# Patient Record
Sex: Female | Born: 1980 | Race: White | Hispanic: No | Marital: Married | Smoking: Never smoker
Health system: Southern US, Community
[De-identification: ages and names within clinical notes are randomized; demographics above are authoritative.]

## PROBLEM LIST (undated history)

## (undated) ENCOUNTER — Inpatient Hospital Stay (HOSPITAL_COMMUNITY): Payer: Self-pay

## (undated) DIAGNOSIS — Z8619 Personal history of other infectious and parasitic diseases: Secondary | ICD-10-CM

## (undated) DIAGNOSIS — R51 Headache: Secondary | ICD-10-CM

## (undated) DIAGNOSIS — D649 Anemia, unspecified: Secondary | ICD-10-CM

## (undated) HISTORY — DX: Personal history of other infectious and parasitic diseases: Z86.19

## (undated) HISTORY — PX: MYRINGOTOMY: SUR874

## (undated) HISTORY — DX: Anemia, unspecified: D64.9

## (undated) HISTORY — DX: Headache: R51

---

## 2007-06-12 ENCOUNTER — Inpatient Hospital Stay (HOSPITAL_COMMUNITY): Admission: AD | Admit: 2007-06-12 | Discharge: 2007-06-12 | Payer: Self-pay | Admitting: Obstetrics and Gynecology

## 2007-07-19 ENCOUNTER — Inpatient Hospital Stay (HOSPITAL_COMMUNITY): Admission: RE | Admit: 2007-07-19 | Discharge: 2007-07-22 | Payer: Self-pay | Admitting: Obstetrics and Gynecology

## 2007-07-24 ENCOUNTER — Encounter: Admission: RE | Admit: 2007-07-24 | Discharge: 2007-08-23 | Payer: Self-pay | Admitting: Obstetrics and Gynecology

## 2011-01-29 LAB — URINALYSIS, ROUTINE W REFLEX MICROSCOPIC
Bilirubin Urine: NEGATIVE
Glucose, UA: NEGATIVE
Hgb urine dipstick: NEGATIVE
Specific Gravity, Urine: 1.02
Urobilinogen, UA: 0.2
pH: 6.5

## 2011-02-01 LAB — CBC
HCT: 28.9 — ABNORMAL LOW
Hemoglobin: 10.2 — ABNORMAL LOW
MCHC: 35.1
MCV: 92.4
MCV: 93.1
Platelets: 142 — ABNORMAL LOW
Platelets: 146 — ABNORMAL LOW
RBC: 3.13 — ABNORMAL LOW
RDW: 14.2
RDW: 14.9
WBC: 10.2
WBC: 11.2 — ABNORMAL HIGH
WBC: 9.4

## 2011-02-01 LAB — COMPREHENSIVE METABOLIC PANEL
ALT: 10
AST: 19
CO2: 23
Calcium: 9
Chloride: 104
Creatinine, Ser: 0.81
GFR calc Af Amer: >60
GFR calc non Af Amer: >60
Glucose, Bld: 106 — ABNORMAL HIGH
Total Bilirubin: 0.5

## 2011-02-01 LAB — URIC ACID: Uric Acid, Serum: 5.9

## 2011-02-01 LAB — LACTATE DEHYDROGENASE: LDH: 114

## 2011-03-31 LAB — OB RESULTS CONSOLE GC/CHLAMYDIA
Chlamydia: NEGATIVE
Gonorrhea: NEGATIVE

## 2011-03-31 LAB — OB RESULTS CONSOLE RPR: RPR: NONREACTIVE

## 2011-03-31 LAB — OB RESULTS CONSOLE ANTIBODY SCREEN: Antibody Screen: NEGATIVE

## 2011-05-11 NOTE — L&D Delivery Note (Signed)
Delivery Note At 5:32 PM a viable female was delivered via Vaginal, Spontaneous Delivery (Presentation: ;  ).  APGAR: , ; weight .   Placenta status: , .  Cord:  with the following complications: .  Cord pH: not sent  Anesthesia: Epidural  Episiotomy:  Lacerations: first degr Suture Repair: 3.0 chromic Est. Blood Loss (mL): 350 cc  Mom to postpartum.  Baby to nursery-stable.  Meriel Pica 11/01/2011, 5:40 PM

## 2011-10-11 LAB — OB RESULTS CONSOLE GBS: GBS: NEGATIVE

## 2011-10-27 ENCOUNTER — Telehealth (HOSPITAL_COMMUNITY): Payer: Self-pay | Admitting: *Deleted

## 2011-10-27 ENCOUNTER — Encounter (HOSPITAL_COMMUNITY): Payer: Self-pay | Admitting: *Deleted

## 2011-10-27 NOTE — Telephone Encounter (Signed)
Preadmission screen  

## 2011-10-29 ENCOUNTER — Encounter (HOSPITAL_COMMUNITY): Payer: Self-pay | Admitting: *Deleted

## 2011-10-29 ENCOUNTER — Telehealth (HOSPITAL_COMMUNITY): Payer: Self-pay | Admitting: *Deleted

## 2011-10-29 NOTE — Telephone Encounter (Signed)
Preadmission screen  

## 2011-11-01 ENCOUNTER — Inpatient Hospital Stay (HOSPITAL_COMMUNITY)
Admission: RE | Admit: 2011-11-01 | Discharge: 2011-11-02 | DRG: 373 | Disposition: A | Discharge status: Home / Self Care | Payer: BC Managed Care – PPO | Source: Ambulatory Visit | Attending: Obstetrics and Gynecology | Admitting: Obstetrics and Gynecology

## 2011-11-01 ENCOUNTER — Encounter (HOSPITAL_COMMUNITY): Payer: Self-pay

## 2011-11-01 ENCOUNTER — Inpatient Hospital Stay (HOSPITAL_COMMUNITY): Payer: BC Managed Care – PPO | Admitting: Anesthesiology

## 2011-11-01 ENCOUNTER — Encounter (HOSPITAL_COMMUNITY): Payer: Self-pay | Admitting: Anesthesiology

## 2011-11-01 LAB — CBC
Hemoglobin: 10.6 g/dL — ABNORMAL LOW (ref 12.0–15.0)
MCH: 30.4 pg (ref 26.0–34.0)
MCHC: 32.6 g/dL (ref 30.0–36.0)
MCV: 93.1 fL (ref 78.0–100.0)
Platelets: 184 10*3/uL (ref 150–400)

## 2011-11-01 LAB — ABO/RH: ABO/RH(D): A POS

## 2011-11-01 LAB — RPR: RPR Ser Ql: NONREACTIVE

## 2011-11-01 MED ORDER — WITCH HAZEL-GLYCERIN EX PADS
1.0000 "application " | MEDICATED_PAD | CUTANEOUS | Status: DC | PRN
  Administered 2011-11-01: 1 via TOPICAL

## 2011-11-01 MED ORDER — OXYTOCIN 40 UNITS IN LACTATED RINGERS INFUSION - SIMPLE MED
1.0000 m[IU]/min | INTRAVENOUS | Status: DC
  Administered 2011-11-01: 2 m[IU]/min via INTRAVENOUS

## 2011-11-01 MED ORDER — EPHEDRINE 5 MG/ML INJ: 10.0000 mg | INTRAVENOUS | Status: DC | PRN

## 2011-11-01 MED ORDER — OXYTOCIN 40 UNITS IN LACTATED RINGERS INFUSION - SIMPLE MED
62.5000 mL/h | Freq: Once | INTRAVENOUS | Status: DC
  Filled 2011-11-01: qty 1000

## 2011-11-01 MED ORDER — PHENYLEPHRINE 40 MCG/ML (10ML) SYRINGE FOR IV PUSH (FOR BLOOD PRESSURE SUPPORT): 80.0000 ug | PREFILLED_SYRINGE | INTRAVENOUS | Status: DC | PRN

## 2011-11-01 MED ORDER — EPHEDRINE 5 MG/ML INJ
10.0000 mg | INTRAVENOUS | Status: DC | PRN
  Filled 2011-11-01: qty 4

## 2011-11-01 MED ORDER — SENNOSIDES-DOCUSATE SODIUM 8.6-50 MG PO TABS
2.0000 | ORAL_TABLET | Freq: Every day | ORAL | Status: DC
  Administered 2011-11-01: 2 via ORAL

## 2011-11-01 MED ORDER — MEASLES, MUMPS & RUBELLA VAC ~~LOC~~ INJ: 0.5000 mL | INJECTION | Freq: Once | SUBCUTANEOUS | Status: DC

## 2011-11-01 MED ORDER — ACETAMINOPHEN 325 MG PO TABS: 650.0000 mg | ORAL_TABLET | ORAL | Status: DC | PRN

## 2011-11-01 MED ORDER — LACTATED RINGERS IV SOLN
INTRAVENOUS | Status: DC
  Administered 2011-11-01 (×2): via INTRAVENOUS

## 2011-11-01 MED ORDER — TETANUS-DIPHTH-ACELL PERTUSSIS 5-2.5-18.5 LF-MCG/0.5 IM SUSP: 0.5000 mL | Freq: Once | INTRAMUSCULAR | Status: DC

## 2011-11-01 MED ORDER — DIPHENHYDRAMINE HCL 25 MG PO CAPS: 25.0000 mg | ORAL_CAPSULE | Freq: Four times a day (QID) | ORAL | Status: DC | PRN

## 2011-11-01 MED ORDER — DIPHENHYDRAMINE HCL 50 MG/ML IJ SOLN: 12.5000 mg | INTRAMUSCULAR | Status: DC | PRN

## 2011-11-01 MED ORDER — TERBUTALINE SULFATE 1 MG/ML IJ SOLN: 0.2500 mg | Freq: Once | INTRAMUSCULAR | Status: DC | PRN

## 2011-11-01 MED ORDER — LIDOCAINE HCL (PF) 1 % IJ SOLN
30.0000 mL | INTRAMUSCULAR | Status: DC | PRN
  Filled 2011-11-01: qty 30

## 2011-11-01 MED ORDER — PHENYLEPHRINE 40 MCG/ML (10ML) SYRINGE FOR IV PUSH (FOR BLOOD PRESSURE SUPPORT)
80.0000 ug | PREFILLED_SYRINGE | INTRAVENOUS | Status: DC | PRN
  Filled 2011-11-01: qty 5

## 2011-11-01 MED ORDER — FLEET ENEMA 7-19 GM/118ML RE ENEM: 1.0000 | ENEMA | Freq: Every day | RECTAL | Status: DC | PRN

## 2011-11-01 MED ORDER — OXYCODONE-ACETAMINOPHEN 5-325 MG PO TABS
1.0000 | ORAL_TABLET | Freq: Four times a day (QID) | ORAL | Status: DC | PRN
  Administered 2011-11-02: 1 via ORAL
  Filled 2011-11-01: qty 1

## 2011-11-01 MED ORDER — IBUPROFEN 800 MG PO TABS
800.0000 mg | ORAL_TABLET | Freq: Three times a day (TID) | ORAL | Status: DC | PRN
  Administered 2011-11-01 – 2011-11-02 (×3): 800 mg via ORAL
  Filled 2011-11-01 (×3): qty 1

## 2011-11-01 MED ORDER — ONDANSETRON HCL 4 MG PO TABS: 4.0000 mg | ORAL_TABLET | ORAL | Status: DC | PRN

## 2011-11-01 MED ORDER — SIMETHICONE 80 MG PO CHEW: 80.0000 mg | CHEWABLE_TABLET | ORAL | Status: DC | PRN

## 2011-11-01 MED ORDER — FENTANYL 2.5 MCG/ML BUPIVACAINE 1/10 % EPIDURAL INFUSION (WH - ANES)
14.0000 mL/h | INTRAMUSCULAR | Status: DC
  Administered 2011-11-01: 14 mL/h via EPIDURAL
  Administered 2011-11-01: 13 mL/h via EPIDURAL
  Filled 2011-11-01 (×2): qty 60

## 2011-11-01 MED ORDER — BENZOCAINE-MENTHOL 20-0.5 % EX AERO
1.0000 "application " | INHALATION_SPRAY | CUTANEOUS | Status: DC | PRN
  Administered 2011-11-01: 1 via TOPICAL
  Filled 2011-11-01: qty 56

## 2011-11-01 MED ORDER — OXYCODONE-ACETAMINOPHEN 5-325 MG PO TABS: 1.0000 | ORAL_TABLET | ORAL | Status: DC | PRN

## 2011-11-01 MED ORDER — ONDANSETRON HCL 4 MG/2ML IJ SOLN: 4.0000 mg | Freq: Four times a day (QID) | INTRAMUSCULAR | Status: DC | PRN

## 2011-11-01 MED ORDER — OXYTOCIN BOLUS FROM INFUSION
250.0000 mL | Freq: Once | INTRAVENOUS | Status: DC
  Filled 2011-11-01: qty 500

## 2011-11-01 MED ORDER — PRENATAL MULTIVITAMIN CH
1.0000 | ORAL_TABLET | Freq: Every day | ORAL | Status: DC
  Administered 2011-11-01 – 2011-11-02 (×2): 1 via ORAL
  Filled 2011-11-01 (×2): qty 1

## 2011-11-01 MED ORDER — FLEET ENEMA 7-19 GM/118ML RE ENEM: 1.0000 | ENEMA | RECTAL | Status: DC | PRN

## 2011-11-01 MED ORDER — ZOLPIDEM TARTRATE 5 MG PO TABS: 5.0000 mg | ORAL_TABLET | Freq: Every evening | ORAL | Status: DC | PRN

## 2011-11-01 MED ORDER — LANOLIN HYDROUS EX OINT: TOPICAL_OINTMENT | CUTANEOUS | Status: DC | PRN

## 2011-11-01 MED ORDER — DIBUCAINE 1 % RE OINT: 1.0000 "application " | TOPICAL_OINTMENT | RECTAL | Status: DC | PRN

## 2011-11-01 MED ORDER — CITRIC ACID-SODIUM CITRATE 334-500 MG/5ML PO SOLN: 30.0000 mL | ORAL | Status: DC | PRN

## 2011-11-01 MED ORDER — IBUPROFEN 600 MG PO TABS: 600.0000 mg | ORAL_TABLET | Freq: Four times a day (QID) | ORAL | Status: DC | PRN

## 2011-11-01 MED ORDER — BISACODYL 10 MG RE SUPP: 10.0000 mg | Freq: Every day | RECTAL | Status: DC | PRN

## 2011-11-01 MED ORDER — ONDANSETRON HCL 4 MG/2ML IJ SOLN: 4.0000 mg | INTRAMUSCULAR | Status: DC | PRN

## 2011-11-01 MED ORDER — LIDOCAINE HCL (PF) 1 % IJ SOLN
INTRAMUSCULAR | Status: DC | PRN
  Administered 2011-11-01 (×2): 5 mL

## 2011-11-01 MED ORDER — LACTATED RINGERS IV SOLN: 500.0000 mL | Freq: Once | INTRAVENOUS | Status: DC

## 2011-11-01 MED ORDER — LACTATED RINGERS IV SOLN: 500.0000 mL | INTRAVENOUS | Status: DC | PRN

## 2011-11-01 NOTE — Anesthesia Procedure Notes (Signed)
Epidural Patient location during procedure: OB Start time: 11/01/2011 1:10 PM  Staffing Anesthesiologist: Brayton Caves R Performed by: anesthesiologist   Preanesthetic Checklist Completed: patient identified, site marked, surgical consent, pre-op evaluation, timeout performed, IV checked, risks and benefits discussed and monitors and equipment checked  Epidural Patient position: sitting Prep: site prepped and draped and DuraPrep Patient monitoring: continuous pulse ox and blood pressure Approach: midline Injection technique: LOR air and LOR saline  Needle:  Needle type: Tuohy  Needle gauge: 17 G Needle length: 9 cm Needle insertion depth: 6 cm Catheter type: closed end flexible Catheter size: 19 Gauge Catheter at skin depth: 11 cm Test dose: negative  Assessment Events: blood not aspirated, injection not painful, no injection resistance, negative IV test and no paresthesia  Additional Notes Patient identified.  Risk benefits discussed including failed block, incomplete pain control, headache, nerve damage, paralysis, blood pressure changes, nausea, vomiting, reactions to medication both toxic or allergic, and postpartum back pain.  Patient expressed understanding and wished to proceed.  All questions were answered.  Sterile technique used throughout procedure and epidural site dressed with sterile barrier dressing. No paresthesia or other complications noted.The patient did not experience any signs of intravascular injection such as tinnitus or metallic taste in mouth nor signs of intrathecal spread such as rapid motor block. Please see nursing notes for vital signs.

## 2011-11-01 NOTE — Progress Notes (Signed)
3/50%/-2>>AROM>>clear AF, stable FHR

## 2011-11-01 NOTE — H&P (Signed)
Briana Booker  DICTATION # [id number] CSN# [id number]   Meriel Pica, MD 11/01/2011 9:41 AM

## 2011-11-01 NOTE — Anesthesia Preprocedure Evaluation (Signed)
Anesthesia Evaluation  Patient identified by MRN, date of birth, ID band Patient awake    Reviewed: Allergy & Precautions, H&P , Patient's Chart, lab work & pertinent test results  Airway Mallampati: III TM Distance: >3 FB Neck ROM: full    Dental No notable dental hx.    Pulmonary neg pulmonary ROS,  breath sounds clear to auscultation  Pulmonary exam normal       Cardiovascular negative cardio ROS  Rhythm:regular Rate:Normal     Neuro/Psych  Headaches, negative neurological ROS  negative psych ROS   GI/Hepatic negative GI ROS, Neg liver ROS,   Endo/Other  negative endocrine ROSMorbid obesity  Renal/GU negative Renal ROS     Musculoskeletal   Abdominal   Peds  Hematology negative hematology ROS (+)   Anesthesia Other Findings   Reproductive/Obstetrics (+) Pregnancy                           Anesthesia Physical Anesthesia Plan  ASA: III  Anesthesia Plan: Epidural   Post-op Pain Management:    Induction:   Airway Management Planned:   Additional Equipment:   Intra-op Plan:   Post-operative Plan:   Informed Consent: I have reviewed the patients History and Physical, chart, labs and discussed the procedure including the risks, benefits and alternatives for the proposed anesthesia with the patient or authorized representative who has indicated his/her understanding and acceptance.     Plan Discussed with:   Anesthesia Plan Comments:         Anesthesia Quick Evaluation

## 2011-11-01 NOTE — H&P (Signed)
NAMEROBINN, OVERHOLT NO.:  [id number]  MEDICAL RECORD NO.:  [id number]  LOCATION:  9163                          FACILITY:  [facility]  PHYSICIAN:  Duke Salvia. Marcelle Overlie, M.D.DATE OF BIRTH:  06-May-1981  DATE OF ADMISSION:  11/01/2011 DATE OF DISCHARGE:                             HISTORY & PHYSICAL   CHIEF COMPLAINT:  For labor induction, SROM.  HISTORY OF PRESENT ILLNESS:  A 31 year old, G2, P55, EDD November 07, 2011, was scheduled today for AROM induction with a favorable cervix at term, was seen in MAU this morning.  On exam, had ruptured membranes, after examination, clear fluids.  Admitted for labor and induction.  GBS was negative.  Her prenatal course has otherwise been uncomplicated.  One hour GTT was 106.  PAST MEDICAL HISTORY:  Please see the [facility] form for details.  PHYSICAL EXAMINATION:  VITAL SIGNS:  Temperature 98.2, blood pressure 120/78.  HEENT:  Unremarkable.  NECK:  Supple without masses.  LUNGS:  Clear.  CARDIOVASCULAR:  Regular rate and rhythm without murmurs, rubs, or gallops.  BREASTS:  Not examined.  ABDOMEN:  Term fundal height.  Fetal heart rate 148.  Cervix was 3-4, 75% vertex -2.  Clear fluid noted.  EXTREMITIES:  Unremarkable.  NEUROLOGIC:  Unremarkable.  IMPRESSION:  Term intrauterine pregnancy, scheduled for labor induction, spontaneous rupture of membranes.  PLAN:  We will admit for labor.     Emmanuel Gruenhagen M. Marcelle Overlie, M.D.     [facility]/MEDQ  D:  11/01/2011  T:  11/01/2011  Job:  [id number]

## 2011-11-02 LAB — CBC
HCT: 28.8 % — ABNORMAL LOW (ref 36.0–46.0)
MCV: 93.8 fL (ref 78.0–100.0)
RDW: 14.7 % (ref 11.5–15.5)
WBC: 13.1 10*3/uL — ABNORMAL HIGH (ref 4.0–10.5)

## 2011-11-02 NOTE — Anesthesia Postprocedure Evaluation (Signed)
  Anesthesia Post-op Note  Patient: Briana Booker  Procedure(s) Performed: * No surgery found *  Patient Location: Mother/Baby  Anesthesia Type: Epidural  Level of Consciousness: awake  Airway and Oxygen Therapy: Patient Spontanous Breathing  Post-op Pain: none  Post-op Assessment: Post-op Vital signs reviewed  Post-op Vital Signs: Reviewed and stable  Complications: No apparent anesthesia complications

## 2011-11-02 NOTE — Discharge Summary (Signed)
Obstetric Discharge Summary Reason for Admission: rupture of membranes Prenatal Procedures: ultrasound Intrapartum Procedures: spontaneous vaginal delivery Postpartum Procedures: none Complications-Operative and Postpartum: 1 degree perineal laceration Hemoglobin  Date Value Range Status  11/02/2011 9.6* 12.0 - 15.0 g/dL Final     HCT  Date Value Range Status  11/02/2011 28.8* 36.0 - 46.0 % Final    Physical Exam:  General: alert and cooperative Lochia: appropriate Uterine Fundus: firm Incision: perineum intact DVT Evaluation: No evidence of DVT seen on physical exam.  Discharge Diagnoses: Term Pregnancy-delivered  Discharge Information: Date: 11/02/2011 Activity: pelvic rest Diet: routine Medications: PNV, Ibuprofen and Percocet Condition: stable Instructions: refer to practice specific booklet Discharge to: home   Newborn Data: Live born female  Birth Weight: 9 lb 1.7 oz (4130 g) APGAR: 8, 9  Home with mother.  CURTIS,CAROL G 11/02/2011, 8:47 AM

## 2011-11-02 NOTE — Progress Notes (Signed)
Post Partum Day 1 Subjective: no complaints, up ad lib, voiding, tolerating PO, + flatus and desires discharge later this pm  Objective: Blood pressure 113/80, pulse 80, temperature 97.7 F (36.5 C), temperature source Oral, resp. rate 20, height 5\' 1"  (1.549 m), weight 95.255 kg (210 lb), last menstrual period 01/31/2011, SpO2 97.00%, unknown if currently breastfeeding.  Physical Exam:  General: alert and cooperative Lochia: appropriate Uterine Fundus: firm Incision: perineum intact DVT Evaluation: No evidence of DVT seen on physical exam.   Basename 11/02/11 0520 11/01/11 0750  HGB 9.6* 10.6*  HCT 28.8* 32.5*    Assessment/Plan: Plan discharge later this pm   LOS: 1 day   CURTIS,CAROL G 11/02/2011, 8:42 AM

## 2011-11-07 ENCOUNTER — Inpatient Hospital Stay (HOSPITAL_COMMUNITY): Admission: AD | Admit: 2011-11-07 | Payer: Self-pay | Source: Ambulatory Visit | Admitting: Obstetrics and Gynecology

## 2012-11-17 ENCOUNTER — Emergency Department (HOSPITAL_COMMUNITY): Payer: BC Managed Care – PPO

## 2012-11-17 ENCOUNTER — Emergency Department (HOSPITAL_COMMUNITY)
Admission: EM | Admit: 2012-11-17 | Discharge: 2012-11-18 | Disposition: A | Discharge status: Home / Self Care | Payer: BC Managed Care – PPO | Attending: Emergency Medicine | Admitting: Emergency Medicine

## 2012-11-17 ENCOUNTER — Encounter (HOSPITAL_COMMUNITY): Payer: Self-pay | Admitting: Adult Health

## 2012-11-17 DIAGNOSIS — Z8619 Personal history of other infectious and parasitic diseases: Secondary | ICD-10-CM | POA: Insufficient documentation

## 2012-11-17 DIAGNOSIS — B9689 Other specified bacterial agents as the cause of diseases classified elsewhere: Secondary | ICD-10-CM | POA: Insufficient documentation

## 2012-11-17 DIAGNOSIS — R109 Unspecified abdominal pain: Secondary | ICD-10-CM | POA: Insufficient documentation

## 2012-11-17 DIAGNOSIS — R110 Nausea: Secondary | ICD-10-CM | POA: Insufficient documentation

## 2012-11-17 DIAGNOSIS — A499 Bacterial infection, unspecified: Secondary | ICD-10-CM | POA: Insufficient documentation

## 2012-11-17 DIAGNOSIS — N760 Acute vaginitis: Secondary | ICD-10-CM | POA: Insufficient documentation

## 2012-11-17 DIAGNOSIS — Z862 Personal history of diseases of the blood and blood-forming organs and certain disorders involving the immune mechanism: Secondary | ICD-10-CM | POA: Insufficient documentation

## 2012-11-17 DIAGNOSIS — Z3202 Encounter for pregnancy test, result negative: Secondary | ICD-10-CM | POA: Insufficient documentation

## 2012-11-17 LAB — URINALYSIS, ROUTINE W REFLEX MICROSCOPIC
Glucose, UA: NEGATIVE mg/dL
Hgb urine dipstick: NEGATIVE
Leukocytes, UA: NEGATIVE
Specific Gravity, Urine: 1.028 (ref 1.005–1.030)
pH: 5.5 (ref 5.0–8.0)

## 2012-11-17 LAB — CBC WITH DIFFERENTIAL/PLATELET
Basophils Relative: 0 % (ref 0–1)
Eosinophils Absolute: 0.1 10*3/uL (ref 0.0–0.7)
HCT: 36.6 % (ref 36.0–46.0)
Hemoglobin: 12.3 g/dL (ref 12.0–15.0)
MCH: 29.9 pg (ref 26.0–34.0)
MCHC: 33.6 g/dL (ref 30.0–36.0)
Monocytes Absolute: 0.5 10*3/uL (ref 0.1–1.0)
Monocytes Relative: 6 % (ref 3–12)
Neutrophils Relative %: 72 % (ref 43–77)

## 2012-11-17 LAB — WET PREP, GENITAL
Trich, Wet Prep: NONE SEEN
Yeast Wet Prep HPF POC: NONE SEEN

## 2012-11-17 LAB — BASIC METABOLIC PANEL
BUN: 13 mg/dL (ref 6–23)
Creatinine, Ser: 0.85 mg/dL (ref 0.50–1.10)
GFR calc Af Amer: >90 mL/min (ref >90)
GFR calc non Af Amer: >90 mL/min (ref >90)

## 2012-11-17 MED ORDER — IOHEXOL 300 MG/ML  SOLN
25.0000 mL | Freq: Once | INTRAMUSCULAR | Status: AC | PRN
  Administered 2012-11-17: 25 mL via ORAL

## 2012-11-17 MED ORDER — IOHEXOL 300 MG/ML  SOLN
100.0000 mL | Freq: Once | INTRAMUSCULAR | Status: AC | PRN
  Administered 2012-11-17: 100 mL via INTRAVENOUS

## 2012-11-17 NOTE — ED Notes (Addendum)
Presents with mid abdominal pain and lower back pain that began Sunday described as cramping and assoicated with nausea and lack of appetite. Pain is worse in lower back than in stomach. Sent over by Dr. Zachery Dauer office to rule out appendicitis and high WBC count.  Pt is currently breastfeeding at night time. Last food at 1 pm, last drink at 18:33.

## 2012-11-17 NOTE — ED Provider Notes (Signed)
History    CSN: [id number] Arrival date & time 11/17/12  1816  First MD Initiated Contact with Patient 11/17/12 2106     Chief Complaint  Patient presents with  . Abdominal Pain   (Consider location/radiation/quality/duration/timing/severity/associated sxs/prior Treatment) HPI Comments: Patient presents today with a chief complaint of lower abdominal pain.  Pain primarily in the suprapubic and RLQ.  She reports that the pain has been present for the past 5 days and has been constant.  Pain does not radiate.  Nothing makes the pain better or worse.  She has taken Tylenol for the pain without relief.  She was seen by her PCP prior to arrival today and was sent to the ED to rule out an Appendicitis.  She denies fever or chills.  She reports some mild nausea, but no vomiting.  Denies diarrhea.  Last BM was yesterday.  She reports that her bowel movement was harder in consistency than usual and she had to strain somewhat.  She denies urinary symptoms.  Denies vaginal bleeding or discharge.  No prior abdominal surgeries.  The history is provided by the patient.   Past Medical History  Diagnosis Date  . H/O varicella   . Headache(784.0)     migraine  . Anemia    Past Surgical History  Procedure Laterality Date  . Myringotomy      x2   Family History  Problem Relation Age of Onset  . Diabetes Father   . Hypertension Father   . Muscular dystrophy Cousin    History  Substance Use Topics  . Smoking status: Never Smoker   . Smokeless tobacco: Never Used  . Alcohol Use: No   OB History   Grav Para Term Preterm Abortions TAB SAB Ect Mult Living   2 2 2  0 0 0 0 0 0 2     Review of Systems  Constitutional: Negative for fever and chills.  Gastrointestinal: Positive for abdominal pain. Negative for nausea, vomiting, diarrhea, constipation and blood in stool.  Genitourinary: Negative for dysuria, urgency, frequency, vaginal bleeding and vaginal discharge.  All other systems reviewed  and are negative.    Allergies  Review of patient's allergies indicates no known allergies.  Home Medications   Current Outpatient Rx  Name  Route  Sig  Dispense  Refill  . acetaminophen (TYLENOL) 325 MG tablet   Oral   Take 650 mg by mouth 2 (two) times daily as needed for pain.         . Oxycodone-Acetaminophen (PERCOCET PO)   Oral   Take 0.5 tablets by mouth daily as needed (pain).          BP 132/81  Pulse 99  Temp(Src) 98.6 F (37 C) (Oral)  Resp 18  SpO2 19% Physical Exam  Nursing note and vitals reviewed. Constitutional: She appears well-developed and well-nourished.  HENT:  Head: Normocephalic and atraumatic.  Neck: Normal range of motion. Neck supple.  Cardiovascular: Normal rate, regular rhythm and normal heart sounds.   Pulmonary/Chest: Effort normal and breath sounds normal.  Abdominal: Soft. Normal appearance and bowel sounds are normal. She exhibits no distension and no mass. There is tenderness in the right lower quadrant and suprapubic area. There is no rebound and no guarding.  Genitourinary: Vagina normal. Cervix exhibits no motion tenderness. Right adnexum displays no mass, no tenderness and no fullness. Left adnexum displays no mass, no tenderness and no fullness.  Neurological: She is alert.  Skin: Skin is warm and dry.  Psychiatric: She has a normal mood and affect.    ED Course  Procedures (including critical care time) Labs Reviewed  BASIC METABOLIC PANEL - Abnormal; Notable for the following:    Glucose, Bld 116 (*)    All other components within normal limits  URINALYSIS, ROUTINE W REFLEX MICROSCOPIC - Abnormal; Notable for the following:    Bilirubin Urine SMALL (*)    All other components within normal limits  GC/CHLAMYDIA PROBE AMP  WET PREP, GENITAL  CBC WITH DIFFERENTIAL  POCT PREGNANCY, URINE   Ct Abdomen Pelvis W Contrast  11/18/2012   *RADIOLOGY REPORT*  Clinical Data: Mid abdominal and lower back pain.  Nausea.  Leukocytosis.  Clinical concern for appendicitis.  CT ABDOMEN AND PELVIS WITH CONTRAST  Technique:  Multidetector CT imaging of the abdomen and pelvis was performed following the standard protocol during bolus administration of intravenous contrast.  Contrast: OMNIPAQUE IOHEXOL 300 MG/ML  SOLN  Comparison: None.  Findings: Normally positioned intrauterine device.  Normal appearing ovaries, urinary bladder, kidneys, pancreas, gallbladder, adrenal glands, liver and spleen.  Minimally prominent mesenteric lymph nodes without pathological enlargement.  No gastrointestinal abnormalities.  Normal appearing appendix.  No free peritoneal fluid or air.  Clear lung bases.  Unremarkable bones.  IMPRESSION: Normal examination.   Original Report Authenticated By: Beckie Salts, M.D.   No diagnosis found.  MDM  Patient presenting with RLQ and suprapubic abdominal pain that has been present for the past 5 days.  Patient sent to the ED by her PCP to obtain a CT to rule out an Acute Appendicitis.  Patient is afebrile.  Labs and UA unremarkable aside from clue cells on the wet prep.  Urine pregnancy negative.  CT scan results as above are normal.  Therefore, feel that the patient is stable for discharge.  Pascal Lux [facility], PA-C 11/18/12 1810

## 2012-11-17 NOTE — ED Notes (Signed)
Pt. C/o abdominal pain, low back pain and nausea since Sunday. Denies vomiting, pain with urination, vaginal discharge. Pt. Sent here from  PCP to rule out appendicitis. No rebound tenderness.

## 2012-11-18 LAB — GC/CHLAMYDIA PROBE AMP
CT Probe RNA: NEGATIVE
GC Probe RNA: NEGATIVE

## 2012-11-18 MED ORDER — HYDROCODONE-ACETAMINOPHEN 5-325 MG PO TABS: 1.0000 | ORAL_TABLET | Freq: Four times a day (QID) | ORAL | Status: DC | PRN

## 2012-11-18 MED ORDER — ONDANSETRON 4 MG PO TBDP: 4.0000 mg | ORAL_TABLET | Freq: Three times a day (TID) | ORAL | Status: DC | PRN

## 2012-11-18 MED ORDER — ONDANSETRON HCL 4 MG/2ML IJ SOLN
4.0000 mg | Freq: Once | INTRAMUSCULAR | Status: AC
  Administered 2012-11-18: 4 mg via INTRAVENOUS
  Filled 2012-11-18: qty 2

## 2012-11-18 MED ORDER — METRONIDAZOLE 500 MG PO TABS: 500.0000 mg | ORAL_TABLET | Freq: Two times a day (BID) | ORAL | Status: DC

## 2012-11-18 NOTE — ED Provider Notes (Signed)
Medical screening examination/treatment/procedure(s) were performed by non-physician practitioner and as supervising physician I was immediately available for consultation/collaboration.   Glynn Octave, MD 11/18/12 [phone]

## 2012-11-18 NOTE — ED Notes (Signed)
Unable to obtain e-signature due to signature pad not working.  Pt verbalized understanding of d/c and f/u.  No additional questions by pt.  VSS. Ambulatory at discharge.

## 2013-12-22 IMAGING — CT CT ABD-PELV W/ CM
2 of 4 series · 16 of 46 positions shown, 18 images · IV contrast (APPLIED)
Comparison: None.

CLINICAL DATA: Mid abdominal and lower back pain.  Nausea.
Leukocytosis.  Clinical concern for appendicitis..

CT ABDOMEN AND PELVIS WITH CONTRAST
TECHNIQUE: Multidetector CT imaging of the abdomen and pelvis was
performed following the standard protocol during bolus
administration of intravenous contrast.
Contrast: 100mL OMNIPAQUE IOHEXOL 300 MG/ML  SOLN

[Series 2: abd/ pelvis 5.0 i30f 1 · axial · 0.82mm/px · z∈[+734,+1129]mm · 13 of 87 slices shown, 15 images]
[im 4/87  soft-tissue]
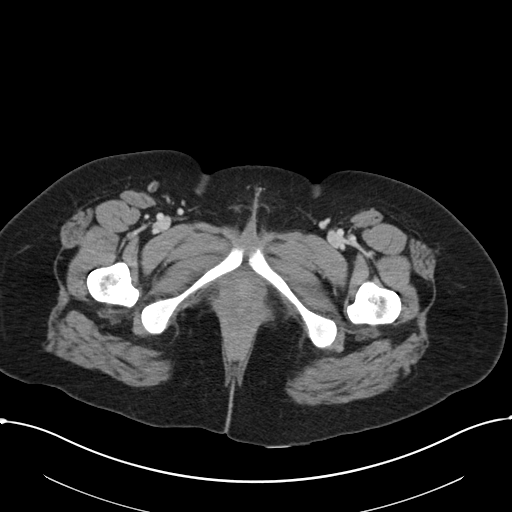
[im 4/87  bone]
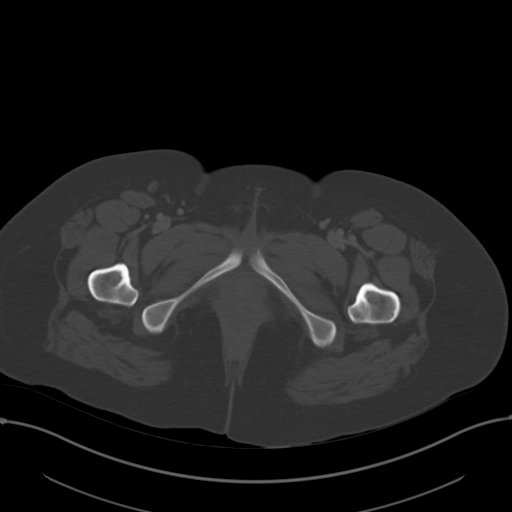
[im 12/87  soft-tissue]
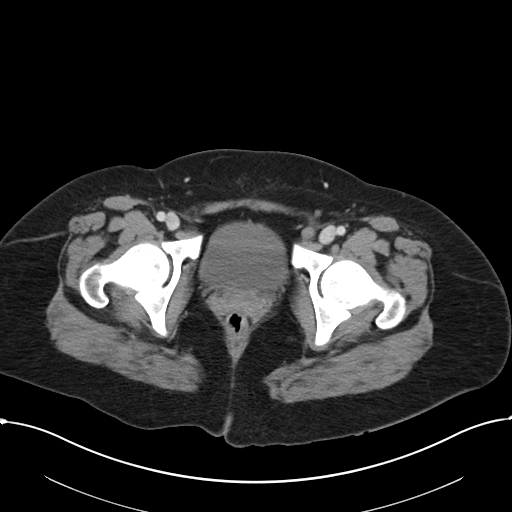
[im 19/87  soft-tissue]
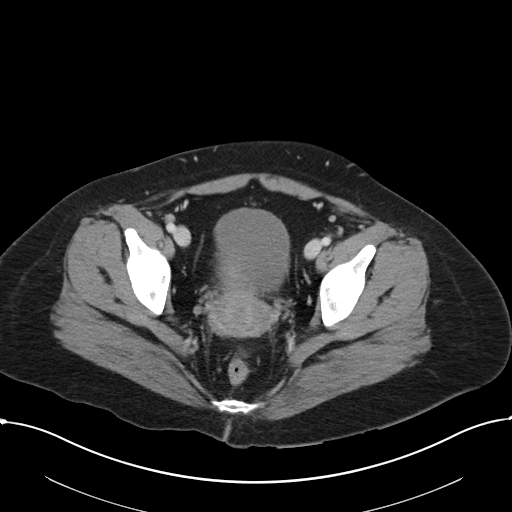
[im 23/87  soft-tissue]
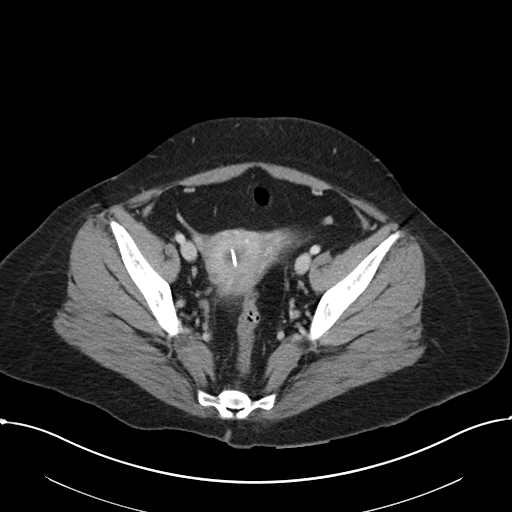
[im 30/87  soft-tissue]
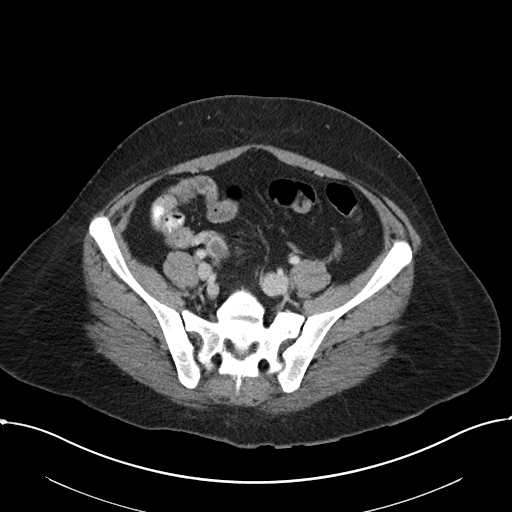
[im 38/87  soft-tissue]
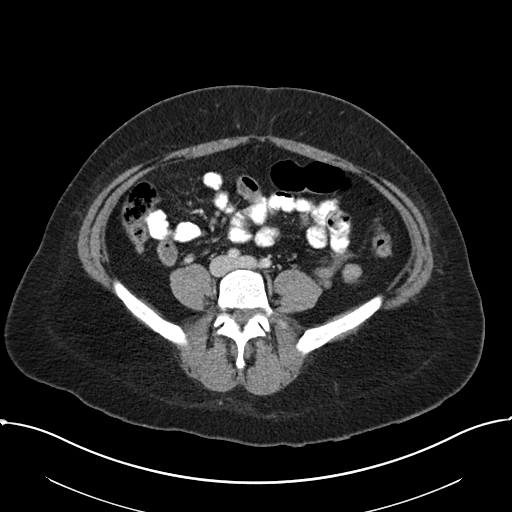
[im 45/87  soft-tissue]
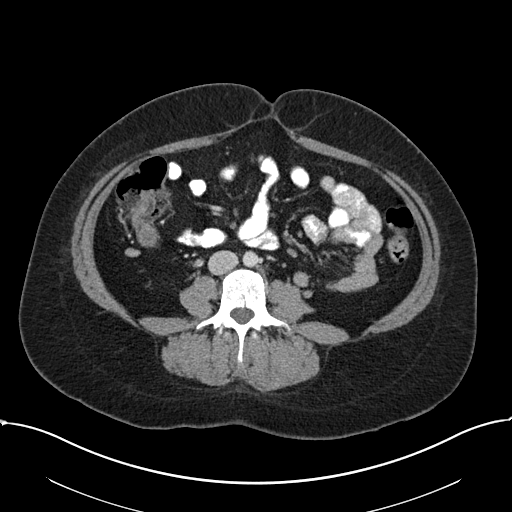
[im 49/87  soft-tissue]
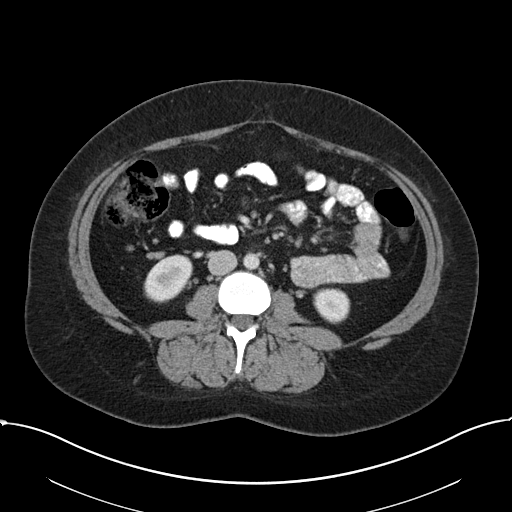
[im 57/87  soft-tissue]
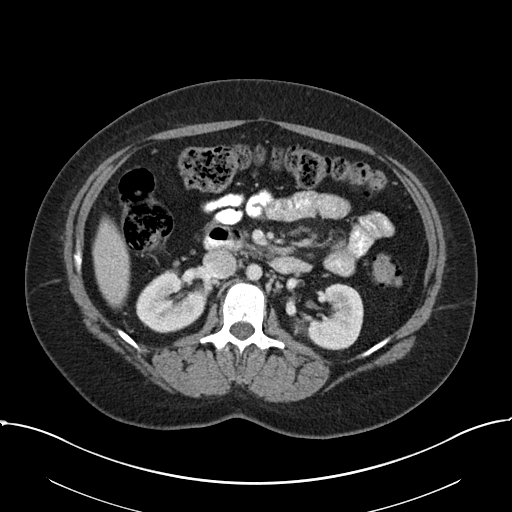
[im 57/87  bone]
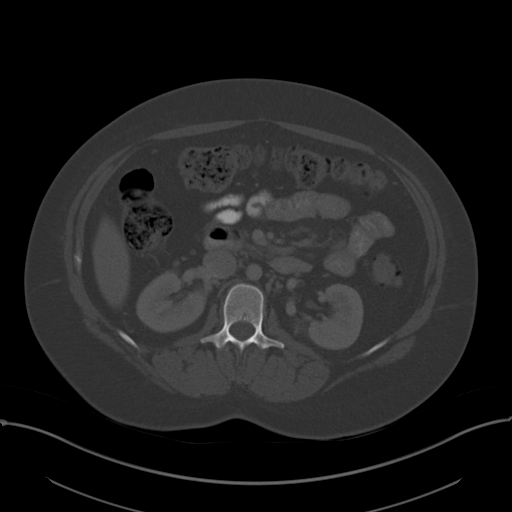
[im 64/87  soft-tissue]
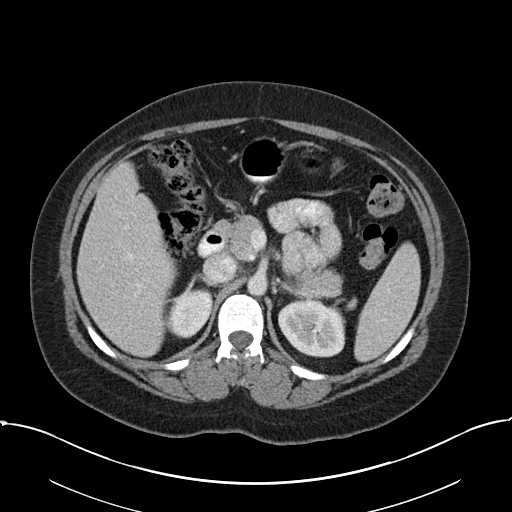
[im 68/87  soft-tissue]
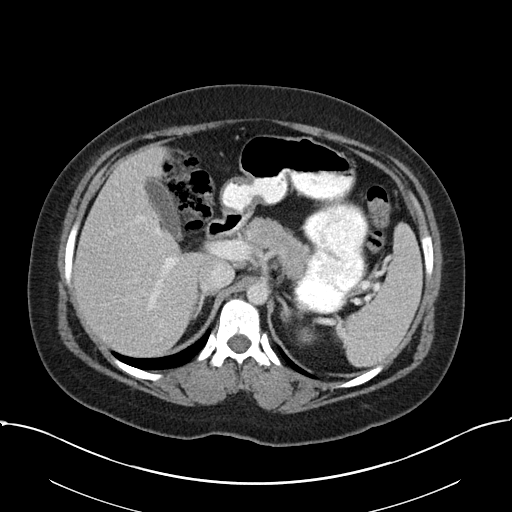
[im 75/87  soft-tissue]
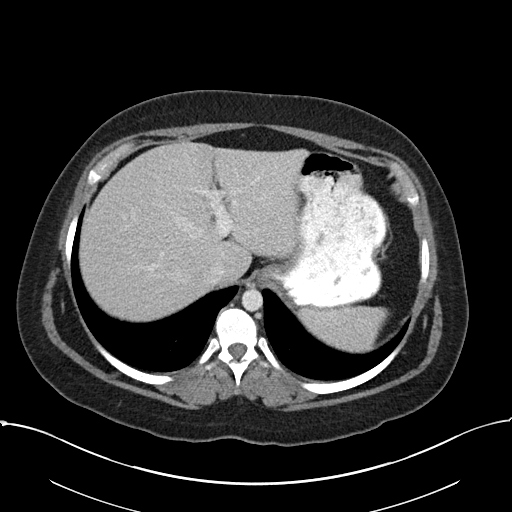
[im 83/87  soft-tissue]
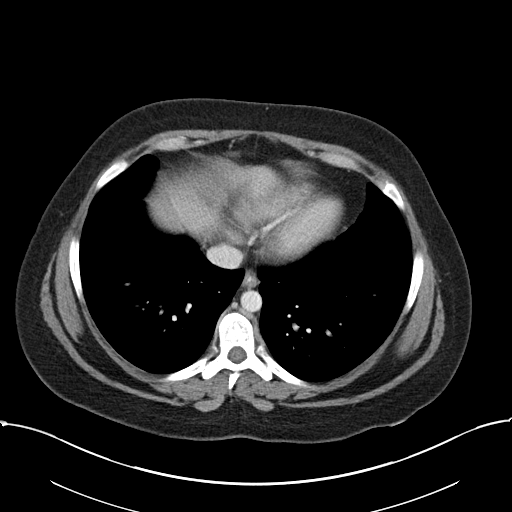

[Series 5: cor · coronal · 0.67mm/px · 3 of 117 slices shown]
[im 39/117  soft-tissue]
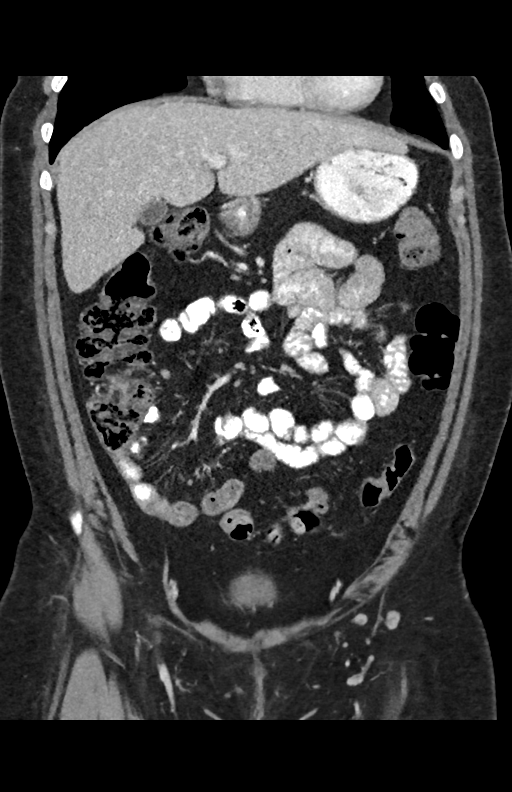
[im 52/117  soft-tissue]
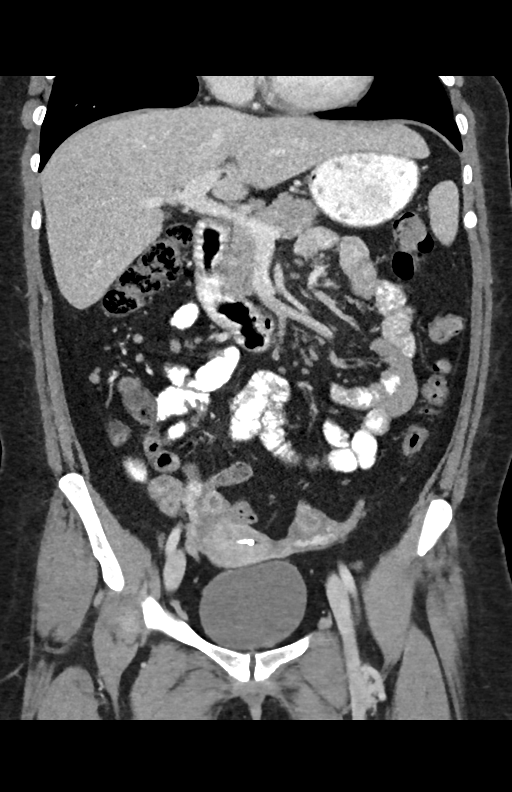
[im 65/117  soft-tissue]
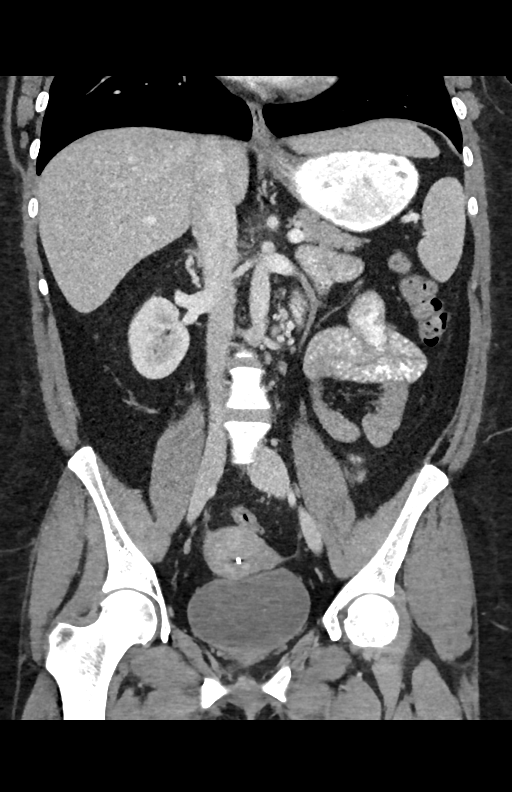

[16 of 46 positions shown; findings below may reference images not displayed]

FINDINGS: Normally positioned intrauterine device.  Normal
appearing ovaries, urinary bladder, kidneys, pancreas, gallbladder,
adrenal glands, liver and spleen.  Minimally prominent mesenteric
lymph nodes without pathological enlargement.  No gastrointestinal
abnormalities.  Normal appearing appendix.  No free peritoneal
fluid or air.  Clear lung bases.  Unremarkable bones.
IMPRESSION: Normal examination.

## 2014-03-11 ENCOUNTER — Encounter (HOSPITAL_COMMUNITY): Payer: Self-pay | Admitting: Adult Health

## 2016-03-09 LAB — OB RESULTS CONSOLE HIV ANTIBODY (ROUTINE TESTING): HIV: NONREACTIVE

## 2016-03-09 LAB — OB RESULTS CONSOLE ANTIBODY SCREEN: Antibody Screen: NEGATIVE

## 2016-03-09 LAB — OB RESULTS CONSOLE ABO/RH: RH TYPE: POSITIVE

## 2016-03-09 LAB — OB RESULTS CONSOLE GC/CHLAMYDIA
Chlamydia: NEGATIVE
Gonorrhea: NEGATIVE

## 2016-03-09 LAB — OB RESULTS CONSOLE RUBELLA ANTIBODY, IGM: RUBELLA: NON-IMMUNE/NOT IMMUNE

## 2016-03-09 LAB — OB RESULTS CONSOLE HEPATITIS B SURFACE ANTIGEN: HEP B S AG: NEGATIVE

## 2016-03-09 LAB — OB RESULTS CONSOLE RPR: RPR: NONREACTIVE

## 2016-03-24 ENCOUNTER — Emergency Department (HOSPITAL_COMMUNITY)
Admission: EM | Admit: 2016-03-24 | Discharge: 2016-03-24 | Disposition: A | Discharge status: Home / Self Care | Payer: BC Managed Care – PPO | Attending: Emergency Medicine | Admitting: Emergency Medicine

## 2016-03-24 ENCOUNTER — Encounter (HOSPITAL_COMMUNITY): Payer: Self-pay | Admitting: Emergency Medicine

## 2016-03-24 ENCOUNTER — Emergency Department (HOSPITAL_COMMUNITY): Payer: BC Managed Care – PPO

## 2016-03-24 DIAGNOSIS — O200 Threatened abortion: Secondary | ICD-10-CM

## 2016-03-24 DIAGNOSIS — R55 Syncope and collapse: Secondary | ICD-10-CM | POA: Diagnosis not present

## 2016-03-24 DIAGNOSIS — O4690 Antepartum hemorrhage, unspecified, unspecified trimester: Secondary | ICD-10-CM

## 2016-03-24 DIAGNOSIS — O209 Hemorrhage in early pregnancy, unspecified: Secondary | ICD-10-CM | POA: Diagnosis present

## 2016-03-24 LAB — URINE MICROSCOPIC-ADD ON

## 2016-03-24 LAB — BASIC METABOLIC PANEL
Anion gap: 8 (ref 5–15)
BUN: 9 mg/dL (ref 6–20)
CO2: 22 mmol/L (ref 22–32)
Calcium: 9 mg/dL (ref 8.9–10.3)
Chloride: 105 mmol/L (ref 101–111)
Creatinine, Ser: 0.58 mg/dL (ref 0.44–1.00)
GFR calc Af Amer: >60 mL/min (ref >60)
GFR calc non Af Amer: >60 mL/min (ref >60)
Glucose, Bld: 91 mg/dL (ref 65–99)
Potassium: 3.6 mmol/L (ref 3.5–5.1)
Sodium: 135 mmol/L (ref 135–145)

## 2016-03-24 LAB — CBC
HCT: 33.5 % — ABNORMAL LOW (ref 36.0–46.0)
Hemoglobin: 11.3 g/dL — ABNORMAL LOW (ref 12.0–15.0)
MCH: 30.1 pg (ref 26.0–34.0)
MCHC: 33.7 g/dL (ref 30.0–36.0)
MCV: 89.1 fL (ref 78.0–100.0)
Platelets: 208 10*3/uL (ref 150–400)
RBC: 3.76 MIL/uL — ABNORMAL LOW (ref 3.87–5.11)
RDW: 12.7 % (ref 11.5–15.5)
WBC: 7.1 10*3/uL (ref 4.0–10.5)

## 2016-03-24 LAB — URINALYSIS, ROUTINE W REFLEX MICROSCOPIC
Bilirubin Urine: NEGATIVE
Glucose, UA: NEGATIVE mg/dL
Ketones, ur: NEGATIVE mg/dL
Leukocytes, UA: NEGATIVE
Nitrite: NEGATIVE
Protein, ur: NEGATIVE mg/dL
Specific Gravity, Urine: 1.004 — ABNORMAL LOW (ref 1.005–1.030)
pH: 7 (ref 5.0–8.0)

## 2016-03-24 LAB — CBG MONITORING, ED: Glucose-Capillary: 84 mg/dL (ref 65–99)

## 2016-03-24 LAB — HCG, QUANTITATIVE, PREGNANCY: hCG, Beta Chain, Quant, S: 81955 m[IU]/mL — ABNORMAL HIGH (ref <5)

## 2016-03-24 NOTE — ED Triage Notes (Signed)
Pt is 10.[redacted] weeks pregnant; noticed vaginal bleeding beginning this AM; denies presence of clots; pt was sitting on toilet when she had extreme dizziness and fell to the side; pt was caught by husband who reports pt was disorientated for several minutes following; A&Ox4; ambulatory to treatment room; pt is tearful at present

## 2016-03-24 NOTE — ED Provider Notes (Signed)
[facility]-EMERGENCY DEPT Provider Note   CSN: [id number] Arrival date & time: 03/24/16  [id number]     History   Chief Complaint Chief Complaint  Patient presents with  . Vaginal Bleeding    10.[redacted] weeks pregnant  . Near Syncope    HPI Briana Booker is a 35 y.o. female.  HPI   35yF with near syncope. She is ~10.[redacted] weeks pregnant. She reports pre-natal care with this pregnancy and transvaginal US around 6w showing IUP with heart tones. This morning she had some suprapubic pressure and felt like she may need to urinate. She went to the bathroom and passed blood. As she was sitting on the toiley she began to feel lightheaded. Nauseated. Diaphoretic. Her significant other reports she was "swaying" back and forth while sitting on the toilet like she may pass out. Unclear if actually had LOC. Denies CP. Currently feeling better aside form understandably being emotionally upset. G3P2. Denies problems with other pregnancy.  Past Medical History:  Diagnosis Date  . Anemia   . H/O varicella   . Headache(784.0)    migraine    There are no active problems to display for this patient.   Past Surgical History:  Procedure Laterality Date  . MYRINGOTOMY     x2    OB History    Gravida Para Term Preterm AB Living   3 2 2  0 0 2   SAB TAB Ectopic Multiple Live Births   0 0 0 0 2       Home Medications    Prior to Admission medications   Medication Sig Start Date End Date Taking? Authorizing Provider  acetaminophen (TYLENOL) 325 MG tablet Take 650 mg by mouth 2 (two) times daily as needed for pain.    Historical Provider, MD  HYDROcodone-acetaminophen (NORCO/VICODIN) 5-325 MG per tablet Take 1 tablet by mouth every 6 (six) hours as needed for pain. 11/18/12   Heather Laisure, PA-C  metroNIDAZOLE (FLAGYL) 500 MG tablet Take 1 tablet (500 mg total) by mouth 2 (two) times daily. 11/18/12   Heather Laisure, PA-C  ondansetron (ZOFRAN ODT) 4 MG disintegrating tablet Take 1 tablet (4 mg total)  by mouth every 8 (eight) hours as needed for nausea. 11/18/12   Santiago GladHeather Laisure, PA-C  Oxycodone-Acetaminophen (PERCOCET PO) Take 0.5 tablets by mouth daily as needed (pain).    Historical Provider, MD    Family History Family History  Problem Relation Age of Onset  . Diabetes Father   . Hypertension Father   . Muscular dystrophy Cousin     Social History Social History  Substance Use Topics  . Smoking status: Never Smoker  . Smokeless tobacco: Never Used  . Alcohol use No     Allergies   Patient has no known allergies.   Review of Systems Review of Systems  All systems reviewed and negative, other than as noted in HPI.  Physical Exam Updated Vital Signs BP 125/91 (BP Location: Right Arm)   Pulse 88   Temp 98.2 F (36.8 C)   Resp 22   Ht 5\' 1"  (1.549 m)   Wt 170 lb (77.1 kg)   LMP 01/12/2016 (Exact Date)   SpO2 100%   BMI 32.12 kg/m   Physical Exam  Constitutional: She appears well-developed and well-nourished. No distress.  HENT:  Head: Normocephalic and atraumatic.  Eyes: Conjunctivae are normal. Right eye exhibits no discharge. Left eye exhibits no discharge.  Neck: Neck supple.  Cardiovascular: Normal rate, regular rhythm and normal heart sounds.  Exam reveals no gallop and no friction rub.   No murmur heard. Pulmonary/Chest: Effort normal and breath sounds normal. No respiratory distress.  Abdominal: Soft. She exhibits no distension. There is no tenderness.  Musculoskeletal: She exhibits no edema or tenderness.  Neurological: She is alert.  Skin: Skin is warm and dry.  Psychiatric: She has a normal mood and affect. Her behavior is normal. Thought content normal.  Nursing note and vitals reviewed.    ED Treatments / Results  Labs (all labs ordered are listed, but only abnormal results are displayed) Labs Reviewed  CBC - Abnormal; Notable for the following:       Result Value   RBC 3.76 (*)    Hemoglobin 11.3 (*)    HCT 33.5 (*)    All other  components within normal limits  URINALYSIS, ROUTINE W REFLEX MICROSCOPIC ([facility]) - Abnormal; Notable for the following:    Specific Gravity, Urine 1.004 (*)    Hgb urine dipstick MODERATE (*)    All other components within normal limits  HCG, QUANTITATIVE, PREGNANCY - Abnormal; Notable for the following:    hCG, Beta Chain, Quant, S 81,955 (*)    All other components within normal limits  URINE MICROSCOPIC-ADD ON - Abnormal; Notable for the following:    Squamous Epithelial / LPF 0-5 (*)    Bacteria, UA FEW (*)    All other components within normal limits  BASIC METABOLIC PANEL  CBG MONITORING, ED    EKG  EKG Interpretation  Date/Time:  Wednesday March 24 2016 07:36:11 EST Ventricular Rate:  79 PR Interval:    QRS Duration: 76 QT Interval:  360 QTC Calculation: 413 R Axis:   67 Text Interpretation:  Sinus rhythm Normal ECG Confirmed by Gerhard MunchLOCKWOOD, ROBERT  MD ([id number]) on 03/26/2016 11:08:18 PM       Radiology No results found.   [place] Ob Comp Less 14 Wks  Result Date: 03/24/2016 CLINICAL DATA:  Vaginal bleeding for 1 day. EXAM: OBSTETRIC <14 WK US AND TRANSVAGINAL OB US TECHNIQUE: Both transabdominal and transvaginal ultrasound examinations were performed for complete evaluation of the gestation as well as the maternal uterus, adnexal regions, and pelvic cul-de-sac. Transvaginal technique was performed to assess early pregnancy. COMPARISON:  None. FINDINGS: Intrauterine gestational sac: Single Yolk sac:  Visualized. Embryo:  Visualized. Cardiac Activity: Visualized. Heart Rate: 143  bpm CRL:  32.9  mm   10 w   1 d                  US EDC: 10/20/2015 Subchorionic hemorrhage:  Small subchorionic hemorrhage. Maternal uterus/adnexae: 2.5 x 2.3 x 2.1 cm hypoechoic right ovarian mass likely reflecting a hemorrhagic cyst or endometrioma. Left ovary is not visualized. No pelvic free fluid. IMPRESSION: 1. Single live intrauterine pregnancy as described above. 2. Small subchorionic  hemorrhage. Electronically Signed   By: Elige KoHetal  Patel   On: 03/24/2016 08:47   [place] Ob Transvaginal  Result Date: 03/24/2016 CLINICAL DATA:  Vaginal bleeding for 1 day. EXAM: OBSTETRIC <14 WK US AND TRANSVAGINAL OB US TECHNIQUE: Both transabdominal and transvaginal ultrasound examinations were performed for complete evaluation of the gestation as well as the maternal uterus, adnexal regions, and pelvic cul-de-sac. Transvaginal technique was performed to assess early pregnancy. COMPARISON:  None. FINDINGS: Intrauterine gestational sac: Single Yolk sac:  Visualized. Embryo:  Visualized. Cardiac Activity: Visualized. Heart Rate: 143  bpm CRL:  32.9  mm   10 w   1 d  [place] EDC: 10/20/2015 Subchorionic hemorrhage:  Small subchorionic hemorrhage. Maternal uterus/adnexae: 2.5 x 2.3 x 2.1 cm hypoechoic right ovarian mass likely reflecting a hemorrhagic cyst or endometrioma. Left ovary is not visualized. No pelvic free fluid. IMPRESSION: 1. Single live intrauterine pregnancy as described above. 2. Small subchorionic hemorrhage. Electronically Signed   By: Elige Ko   On: 03/24/2016 08:47    Procedures Procedures (including critical care time)  Medications Ordered in ED Medications - No data to display   Initial Impression / Assessment and Plan / ED Course  I have reviewed the triage vital signs and the nursing notes.  Pertinent labs & imaging results that were available during my care of the patient were reviewed by me and considered in my medical decision making (see chart for details).  Clinical Course     35yF with what sounds like near syncopal event. Suspect vasovagal. Sitting out toilet and passing blood. Emotionally upset. Now no complaints. HD stable. Fortunately, FHT on [place]. Reassurance provided and return precautions discussed. Outpt FU otherwise.   Final Clinical Impressions(s) / ED Diagnoses   Final diagnoses:  Near syncope  Threatened miscarriage    New  Prescriptions New Prescriptions   No medications on file     Raeford Razor, MD 03/27/16 1117

## 2016-03-24 NOTE — ED Notes (Signed)
Delay on pt vitals, ultrasound in progress

## 2016-09-15 LAB — OB RESULTS CONSOLE GBS: GBS: NEGATIVE

## 2016-10-01 ENCOUNTER — Telehealth (HOSPITAL_COMMUNITY): Payer: Self-pay | Admitting: *Deleted

## 2016-10-01 ENCOUNTER — Encounter (HOSPITAL_COMMUNITY): Payer: Self-pay | Admitting: *Deleted

## 2016-10-01 NOTE — Telephone Encounter (Signed)
Preadmission screen  

## 2016-10-05 ENCOUNTER — Encounter (HOSPITAL_COMMUNITY): Payer: Self-pay | Admitting: *Deleted

## 2016-10-05 NOTE — Telephone Encounter (Signed)
Preadmission screen  

## 2016-10-10 ENCOUNTER — Inpatient Hospital Stay (HOSPITAL_COMMUNITY)
Admission: AD | Admit: 2016-10-10 | Discharge: 2016-10-10 | Disposition: A | Discharge status: Home / Self Care | Payer: BC Managed Care – PPO | Source: Ambulatory Visit | Attending: Obstetrics and Gynecology | Admitting: Obstetrics and Gynecology

## 2016-10-10 ENCOUNTER — Encounter (HOSPITAL_COMMUNITY): Payer: Self-pay

## 2016-10-10 DIAGNOSIS — O479 False labor, unspecified: Secondary | ICD-10-CM

## 2016-10-10 NOTE — Progress Notes (Addendum)
G3P2 @ 38.[redacted] wksga. Presents here for contractions that got stronger thru the day past two hrs. Denies LOF or bleeding. +FM. EFM applied. VSS see flow sheet for details.   SVE: 3/50/-2  1552: SVE unchanged.  1612: Dr. Rana SnareLowe notified. Left message to call back.  1615: Dr. Rana SnareLowe returned call. Report status of pt given. Orders received to discharge pt home.   1620: Discharge instructions given with pt understanding. Pt left home via ambulatory with SO.

## 2016-10-10 NOTE — MAU Note (Signed)
Been having contractions for about 2 hrs.  Now every 4-5 min, getting longer.  Back is starting to hurt. No bleeding or leaking.  Was 3+ on Thu, membranes swept. Is to be here for induction tonight.

## 2016-10-11 ENCOUNTER — Inpatient Hospital Stay (HOSPITAL_COMMUNITY): Payer: BC Managed Care – PPO | Admitting: Anesthesiology

## 2016-10-11 ENCOUNTER — Encounter (HOSPITAL_COMMUNITY): Payer: Self-pay

## 2016-10-11 ENCOUNTER — Inpatient Hospital Stay (HOSPITAL_COMMUNITY)
Admission: RE | Admit: 2016-10-11 | Discharge: 2016-10-12 | DRG: 775 | Disposition: A | Discharge status: Home / Self Care | Payer: BC Managed Care – PPO | Source: Ambulatory Visit | Attending: Obstetrics and Gynecology | Admitting: Obstetrics and Gynecology

## 2016-10-11 DIAGNOSIS — Z3493 Encounter for supervision of normal pregnancy, unspecified, third trimester: Secondary | ICD-10-CM | POA: Diagnosis present

## 2016-10-11 DIAGNOSIS — Z3490 Encounter for supervision of normal pregnancy, unspecified, unspecified trimester: Secondary | ICD-10-CM

## 2016-10-11 DIAGNOSIS — Z3A39 39 weeks gestation of pregnancy: Secondary | ICD-10-CM | POA: Diagnosis not present

## 2016-10-11 HISTORY — DX: Encounter for supervision of normal pregnancy, unspecified, unspecified trimester: Z34.90

## 2016-10-11 LAB — TYPE AND SCREEN
ABO/RH(D): A POS
Antibody Screen: NEGATIVE

## 2016-10-11 LAB — CBC
HCT: 29.8 % — ABNORMAL LOW (ref 36.0–46.0)
HEMOGLOBIN: 10 g/dL — AB (ref 12.0–15.0)
MCH: 30.9 pg (ref 26.0–34.0)
MCHC: 33.6 g/dL (ref 30.0–36.0)
MCV: 92 fL (ref 78.0–100.0)
Platelets: 205 10*3/uL (ref 150–400)
RBC: 3.24 MIL/uL — AB (ref 3.87–5.11)
RDW: 14.7 % (ref 11.5–15.5)
WBC: 10.4 10*3/uL (ref 4.0–10.5)

## 2016-10-11 LAB — RPR: RPR: NONREACTIVE

## 2016-10-11 MED ORDER — EPHEDRINE 5 MG/ML INJ
10.0000 mg | INTRAVENOUS | Status: DC | PRN
  Filled 2016-10-11: qty 2

## 2016-10-11 MED ORDER — LACTATED RINGERS IV SOLN: 500.0000 mL | INTRAVENOUS | Status: DC | PRN

## 2016-10-11 MED ORDER — LACTATED RINGERS IV SOLN: 500.0000 mL | Freq: Once | INTRAVENOUS | Status: DC

## 2016-10-11 MED ORDER — DIPHENHYDRAMINE HCL 25 MG PO CAPS: 25.0000 mg | ORAL_CAPSULE | Freq: Four times a day (QID) | ORAL | Status: DC | PRN

## 2016-10-11 MED ORDER — TERBUTALINE SULFATE 1 MG/ML IJ SOLN
0.2500 mg | Freq: Once | INTRAMUSCULAR | Status: DC | PRN
  Filled 2016-10-11: qty 1

## 2016-10-11 MED ORDER — MEDROXYPROGESTERONE ACETATE 150 MG/ML IM SUSP: 150.0000 mg | INTRAMUSCULAR | Status: DC | PRN

## 2016-10-11 MED ORDER — SIMETHICONE 80 MG PO CHEW: 80.0000 mg | CHEWABLE_TABLET | ORAL | Status: DC | PRN

## 2016-10-11 MED ORDER — SOD CITRATE-CITRIC ACID 500-334 MG/5ML PO SOLN
30.0000 mL | ORAL | Status: DC | PRN
Start: 2016-10-11 — End: 2016-10-11

## 2016-10-11 MED ORDER — DIBUCAINE 1 % RE OINT: 1.0000 "application " | TOPICAL_OINTMENT | RECTAL | Status: DC | PRN

## 2016-10-11 MED ORDER — OXYTOCIN 40 UNITS IN LACTATED RINGERS INFUSION - SIMPLE MED
1.0000 m[IU]/min | INTRAVENOUS | Status: DC
  Filled 2016-10-11: qty 1000

## 2016-10-11 MED ORDER — IBUPROFEN 600 MG PO TABS
600.0000 mg | ORAL_TABLET | Freq: Four times a day (QID) | ORAL | Status: DC
  Administered 2016-10-11 – 2016-10-12 (×4): 600 mg via ORAL
  Filled 2016-10-11 (×4): qty 1

## 2016-10-11 MED ORDER — ONDANSETRON HCL 4 MG/2ML IJ SOLN: 4.0000 mg | Freq: Four times a day (QID) | INTRAMUSCULAR | Status: DC | PRN

## 2016-10-11 MED ORDER — ACETAMINOPHEN 325 MG PO TABS
650.0000 mg | ORAL_TABLET | ORAL | Status: DC | PRN
  Administered 2016-10-11: 650 mg via ORAL
  Filled 2016-10-11: qty 2

## 2016-10-11 MED ORDER — TETANUS-DIPHTH-ACELL PERTUSSIS 5-2.5-18.5 LF-MCG/0.5 IM SUSP: 0.5000 mL | Freq: Once | INTRAMUSCULAR | Status: DC

## 2016-10-11 MED ORDER — WITCH HAZEL-GLYCERIN EX PADS: 1.0000 "application " | MEDICATED_PAD | CUTANEOUS | Status: DC | PRN

## 2016-10-11 MED ORDER — COCONUT OIL OIL
1.0000 "application " | TOPICAL_OIL | Status: DC | PRN
  Administered 2016-10-12: 1 via TOPICAL
  Filled 2016-10-11: qty 120

## 2016-10-11 MED ORDER — BENZOCAINE-MENTHOL 20-0.5 % EX AERO: 1.0000 "application " | INHALATION_SPRAY | CUTANEOUS | Status: DC | PRN

## 2016-10-11 MED ORDER — OXYTOCIN 40 UNITS IN LACTATED RINGERS INFUSION - SIMPLE MED: 2.5000 [IU]/h | INTRAVENOUS | Status: DC

## 2016-10-11 MED ORDER — LIDOCAINE HCL (PF) 1 % IJ SOLN
INTRAMUSCULAR | Status: DC | PRN
  Administered 2016-10-11: 4 mL via EPIDURAL

## 2016-10-11 MED ORDER — OXYCODONE-ACETAMINOPHEN 5-325 MG PO TABS: 2.0000 | ORAL_TABLET | ORAL | Status: DC | PRN

## 2016-10-11 MED ORDER — OXYTOCIN BOLUS FROM INFUSION
500.0000 mL | Freq: Once | INTRAVENOUS | Status: AC
  Administered 2016-10-11: 500 mL via INTRAVENOUS

## 2016-10-11 MED ORDER — MISOPROSTOL 25 MCG QUARTER TABLET
25.0000 ug | ORAL_TABLET | ORAL | Status: DC
  Administered 2016-10-11 (×2): 25 ug via VAGINAL
  Filled 2016-10-11 (×8): qty 1

## 2016-10-11 MED ORDER — ONDANSETRON HCL 4 MG/2ML IJ SOLN: 4.0000 mg | INTRAMUSCULAR | Status: DC | PRN

## 2016-10-11 MED ORDER — DIPHENHYDRAMINE HCL 50 MG/ML IJ SOLN: 12.5000 mg | INTRAMUSCULAR | Status: DC | PRN

## 2016-10-11 MED ORDER — METHYLERGONOVINE MALEATE 0.2 MG/ML IJ SOLN
INTRAMUSCULAR | Status: AC
  Filled 2016-10-11: qty 1

## 2016-10-11 MED ORDER — SENNOSIDES-DOCUSATE SODIUM 8.6-50 MG PO TABS
2.0000 | ORAL_TABLET | ORAL | Status: DC
  Administered 2016-10-11: 2 via ORAL
  Filled 2016-10-11: qty 2

## 2016-10-11 MED ORDER — MEASLES, MUMPS & RUBELLA VAC ~~LOC~~ INJ
0.5000 mL | INJECTION | Freq: Once | SUBCUTANEOUS | Status: AC
  Administered 2016-10-12: 0.5 mL via SUBCUTANEOUS
  Filled 2016-10-11: qty 0.5

## 2016-10-11 MED ORDER — FENTANYL 2.5 MCG/ML BUPIVACAINE 1/10 % EPIDURAL INFUSION (WH - ANES)
14.0000 mL/h | INTRAMUSCULAR | Status: DC | PRN
  Administered 2016-10-11 (×2): 14 mL/h via EPIDURAL
  Filled 2016-10-11: qty 100

## 2016-10-11 MED ORDER — ONDANSETRON HCL 4 MG PO TABS: 4.0000 mg | ORAL_TABLET | ORAL | Status: DC | PRN

## 2016-10-11 MED ORDER — PRENATAL MULTIVITAMIN CH: 1.0000 | ORAL_TABLET | Freq: Every day | ORAL | Status: DC

## 2016-10-11 MED ORDER — PHENYLEPHRINE 40 MCG/ML (10ML) SYRINGE FOR IV PUSH (FOR BLOOD PRESSURE SUPPORT)
80.0000 ug | PREFILLED_SYRINGE | INTRAVENOUS | Status: DC | PRN
  Filled 2016-10-11: qty 5

## 2016-10-11 MED ORDER — ACETAMINOPHEN 325 MG PO TABS: 650.0000 mg | ORAL_TABLET | ORAL | Status: DC | PRN

## 2016-10-11 MED ORDER — PHENYLEPHRINE 40 MCG/ML (10ML) SYRINGE FOR IV PUSH (FOR BLOOD PRESSURE SUPPORT)
80.0000 ug | PREFILLED_SYRINGE | INTRAVENOUS | Status: DC | PRN
  Filled 2016-10-11: qty 5
  Filled 2016-10-11: qty 10

## 2016-10-11 MED ORDER — OXYCODONE-ACETAMINOPHEN 5-325 MG PO TABS: 1.0000 | ORAL_TABLET | ORAL | Status: DC | PRN

## 2016-10-11 MED ORDER — LIDOCAINE HCL (PF) 1 % IJ SOLN
30.0000 mL | INTRAMUSCULAR | Status: DC | PRN
  Filled 2016-10-11: qty 30

## 2016-10-11 MED ORDER — BUTORPHANOL TARTRATE 1 MG/ML IJ SOLN: 1.0000 mg | INTRAMUSCULAR | Status: DC | PRN

## 2016-10-11 MED ORDER — LACTATED RINGERS IV SOLN
INTRAVENOUS | Status: DC
  Administered 2016-10-11: 125 mL/h via INTRAVENOUS
  Administered 2016-10-11 (×2): via INTRAVENOUS

## 2016-10-11 NOTE — Lactation Note (Signed)
This note was copied from a baby's chart. Lactation Consultation Note  Patient Name: Briana Booker MWNUU'VToday's Date: 10/11/2016 Reason for consult: Initial assessment   Initital consult at 10 hrs old; Mom is a P3 with experience breastfeeding 2 previous infants.  GA 39.0; BW 9 lbs, 2.9 oz.  Hx of Zoloft until 3rd trimester. Infant has breastfed x2 (20 min); voids-0; stools-1 since birth. Mom resting in bed for evening when LC entered.  Infant in crib. Mom stated feedings are going well.  No complaints. Educated about cluster feeding and continuing to feed with cues. Lactation brochure given and informed of hospital support group and OP services. Told mom LC would follow-up with her tomorrow.   Maternal Data Does the patient have breastfeeding experience prior to this delivery?: Yes  Feeding    LATCH Score/Interventions                      Lactation Tools Discussed/Used     Consult Status Consult Status: Follow-up Date: 10/12/16 Follow-up type: In-patient    Briana Booker, [name] 10/11/2016, 11:40 PM

## 2016-10-11 NOTE — Anesthesia Preprocedure Evaluation (Signed)
Anesthesia Evaluation  Patient identified by MRN, date of birth, ID band Patient awake    Reviewed: Allergy & Precautions, Patient's Chart, lab work & pertinent test results  Airway Mallampati: II  TM Distance: >3 FB Neck ROM: Full    Dental  (+) Teeth Intact, Dental Advisory Given   Pulmonary neg pulmonary ROS,    breath sounds clear to auscultation       Cardiovascular negative cardio ROS   Rhythm:Regular Rate:Normal     Neuro/Psych  Headaches, negative psych ROS   GI/Hepatic negative GI ROS, Neg liver ROS,   Endo/Other  negative endocrine ROS  Renal/GU negative Renal ROS  negative genitourinary   Musculoskeletal negative musculoskeletal ROS (+)   Abdominal   Peds negative pediatric ROS (+)  Hematology negative hematology ROS (+)   Anesthesia Other Findings   Reproductive/Obstetrics (+) Pregnancy                             Lab Results  Component Value Date   WBC 10.4 10/11/2016   HGB 10.0 (L) 10/11/2016   HCT 29.8 (L) 10/11/2016   MCV 92.0 10/11/2016   PLT 205 10/11/2016     Anesthesia Physical Anesthesia Plan  ASA: III  Anesthesia Plan: Epidural   Post-op Pain Management:    Induction:   Airway Management Planned:   Additional Equipment:   Intra-op Plan:   Post-operative Plan:   Informed Consent: I have reviewed the patients History and Physical, chart, labs and discussed the procedure including the risks, benefits and alternatives for the proposed anesthesia with the patient or authorized representative who has indicated his/her understanding and acceptance.     Plan Discussed with:   Anesthesia Plan Comments:         Anesthesia Quick Evaluation

## 2016-10-11 NOTE — H&P (Signed)
Arvin CollardSamantha F Cocco is a 36 y.o. female @ 39 wks presenting for IOL.  Pregnancy uncomplicated.   S/p cytotec x 2 overnight.  OB History    Gravida Para Term Preterm AB Living   3 2 2  0 0 2   SAB TAB Ectopic Multiple Live Births   0 0 0 0 2     Past Medical History:  Diagnosis Date  . Anemia   . H/O varicella   . Headache(784.0)    migraine   Past Surgical History:  Procedure Laterality Date  . MYRINGOTOMY     x2   Family History: family history includes Diabetes in her father; Hypertension in her father; Muscular dystrophy in her cousin. Social History:  reports that she has never smoked. She has never used smokeless tobacco. She reports that she does not drink alcohol or use drugs.     Maternal Diabetes: No Genetic Screening: Normal Maternal Ultrasounds/Referrals: Normal Fetal Ultrasounds or other Referrals:  None Maternal Substance Abuse:  No Significant Maternal Medications:  Meds include: Zoloft Significant Maternal Lab Results:  None Other Comments:  None  ROS History Dilation: 3.5 Effacement (%): 60 Exam by:: P Rumley,RN Blood pressure 119/74, pulse 87, temperature 98.5 F (36.9 C), temperature source Oral, resp. rate 18, height 5\' 1"  (1.549 m), weight 208 lb (94.3 kg), last menstrual period 01/12/2016. Exam Physical Exam  Prenatal labs: ABO, Rh: --/--/A POS (06/04 0105) Antibody: NEG (06/04 0105) Rubella: Nonimmune (10/31 0000) RPR: Nonreactive (10/31 0000)  HBsAg: Negative (10/31 0000)  HIV: Non-reactive (10/31 0000)  GBS: Negative (05/09 0000)   Assessment/Plan: Admit AROM/pit prn Epidural prn   Kalep Full 10/11/2016, 7:48 AM

## 2016-10-11 NOTE — Anesthesia Pain Management Evaluation Note (Signed)
  CRNA Pain Management Visit Note  Patient: Briana Booker, 36 y.o., female  "Hello I am a [facility] the anesthesia [facility]. We have an anesthesia team available at all times to provide care throughout the hospital, including epidural management and anesthesia for C-section. I don't know your plan for the delivery whether it a natural birth, water birth, IV sedation, nitrous supplementation, doula or epidural, but we want to meet your pain goals."   1.Was your pain managed to your expectations on prior hospitalizations?   Yes   2.What is your expectation for pain management during this hospitalization?     Epidural  3.How can we help you reach that goal? epidural  Record the patient's initial score and the patient's pain goal.   Pain: 5  Pain Goal: 2 [facility] wants you to be able to say your pain was always managed very well.  Disa Riedlinger 10/11/2016

## 2016-10-11 NOTE — Anesthesia Procedure Notes (Signed)
Epidural Patient location during procedure: OB Start time: 10/11/2016 9:30 AM End time: 10/11/2016 9:36 AM  Staffing Anesthesiologist: Shona SimpsonHOLLIS, Marguerite Jarboe D Performed: anesthesiologist   Preanesthetic Checklist Completed: patient identified, site marked, surgical consent, pre-op evaluation, timeout performed, IV checked, risks and benefits discussed and monitors and equipment checked  Epidural Patient position: sitting Prep: ChloraPrep Patient monitoring: heart rate, continuous pulse ox and blood pressure Approach: midline Location: L3-L4 Injection technique: LOR saline  Needle:  Needle type: Tuohy  Needle gauge: 17 G Needle length: 9 cm Catheter type: closed end flexible Catheter size: 20 Guage Test dose: negative and 1.5% lidocaine  Assessment Events: blood not aspirated, injection not painful, no injection resistance and no paresthesia  Additional Notes LOR @ 5.5  Patient identified. Risks/Benefits/Options discussed with patient including but not limited to bleeding, infection, nerve damage, paralysis, failed block, incomplete pain control, headache, blood pressure changes, nausea, vomiting, reactions to medications, itching and postpartum back pain. Confirmed with bedside nurse the patient's most recent platelet count. Confirmed with patient that they are not currently taking any anticoagulation, have any bleeding history or any family history of bleeding disorders. Patient expressed understanding and wished to proceed. All questions were answered. Sterile technique was used throughout the entire procedure. Please see nursing notes for vital signs. Test dose was given through epidural catheter and negative prior to continuing to dose epidural or start infusion. Warning signs of high block given to the patient including shortness of breath, tingling/numbness in hands, complete motor block, or any concerning symptoms with instructions to call for help. Patient was given instructions on fall  risk and not to get out of bed. All questions and concerns addressed with instructions to call with any issues or inadequate analgesia.    Reason for block:procedure for pain

## 2016-10-11 NOTE — Progress Notes (Signed)
SVD of vigorous female infant w/ apgars of 9,9.  Placenta delivered spontaneous w/ 3VC.   1st degree lac repaired w/ 3-0 vicryl rapide.  Fundus firm.  EBL 350cc .

## 2016-10-11 NOTE — Progress Notes (Signed)
Pt comfortable w/ epidural  FHT cat 1 Toco Q2 Cvx 3/60/-2 AROM - clear  A/P:  Exp mngt Pitocin prn

## 2016-10-12 LAB — CBC
HEMATOCRIT: 26.1 % — AB (ref 36.0–46.0)
HEMOGLOBIN: 9 g/dL — AB (ref 12.0–15.0)
MCH: 32 pg (ref 26.0–34.0)
MCHC: 34.5 g/dL (ref 30.0–36.0)
MCV: 92.9 fL (ref 78.0–100.0)
PLATELETS: 154 10*3/uL (ref 150–400)
RBC: 2.81 MIL/uL — ABNORMAL LOW (ref 3.87–5.11)
RDW: 14.9 % (ref 11.5–15.5)
WBC: 10.5 10*3/uL (ref 4.0–10.5)

## 2016-10-12 NOTE — Discharge Summary (Signed)
Obstetric Discharge Summary Reason for Admission: induction of labor Prenatal Procedures: ultrasound Intrapartum Procedures: spontaneous vaginal delivery Postpartum Procedures: none Complications-Operative and Postpartum: 1 degree perineal laceration Hemoglobin  Date Value Ref Range Status  10/12/2016 9.0 (L) 12.0 - 15.0 g/dL Final   HCT  Date Value Ref Range Status  10/12/2016 26.1 (L) 36.0 - 46.0 % Final    Physical Exam:  General: alert and cooperative Lochia: appropriate Uterine Fundus: firm Incision: healing well DVT Evaluation: No evidence of DVT seen on physical exam. Negative Homan's sign. No cords or calf tenderness. No significant calf/ankle edema.  Discharge Diagnoses: Term Pregnancy-delivered  Discharge Information: Date: 10/12/2016 Activity: pelvic rest Diet: routine Medications: PNV and Ibuprofen Condition: stable Instructions: refer to practice specific booklet Discharge to: home   Newborn Data: Live born female  Birth Weight: 9 lb 2.9 oz (4165 g) APGAR: 9, 9  Home with mother.  Ayham Word G 10/12/2016, 8:02 AM

## 2016-10-12 NOTE — Progress Notes (Signed)
Post Partum Day 1 Subjective: no complaints, up ad lib, voiding, tolerating PO, + flatus and desires early discharge  Objective: Blood pressure 113/77, pulse 78, temperature 98.5 F (36.9 C), temperature source Oral, resp. rate 18, height 5\' 1"  (1.549 m), weight 208 lb (94.3 kg), last menstrual period 01/12/2016, unknown if currently breastfeeding.  Physical Exam:  General: alert and cooperative Lochia: appropriate Uterine Fundus: firm Incision: healing well DVT Evaluation: No evidence of DVT seen on physical exam. Negative Homan's sign. No cords or calf tenderness. Calf/Ankle edema is present.   Recent Labs  10/11/16 0105 10/12/16 0504  HGB 10.0* 9.0*  HCT 29.8* 26.1*    Assessment/Plan: Discharge home, Breastfeeding and Circumcision prior to discharge   LOS: 1 day   Romelle Reiley G 10/12/2016, 7:57 AM

## 2016-10-12 NOTE — Lactation Note (Signed)
This note was copied from a baby's chart. Lactation Consultation Note  Patient Name: Briana Booker Reason for consult: Follow-up assessment  Visited with Mom on day of discharge, baby 6324 hrs old.  Baby circumcised with morning.  Mom latched baby, STS on left side using cross cradle.  Left nipple noted to have positional stripe on it.  Mom states baby has occasionally latched too shallow.  Mom initially wanted to use cradle hold, but encouraged her to use cross cradle hold to better control for a deeper latch.  Baby tucked in lower lip, so demonstrated a chin tug for Mom and FOB.  Encouraged STS and feeding often on cue. Goal of 8-12 feedings per 24 hrs.  Pacifier at bedside.  Recommended holding off on pacifier use until breastfeeding established, and no soreness on nipples.  Mom using coconut oil.   Mom aware of OP lactation services available to her. Encouraged Mom to call prn.   Judee ClaraSmith, Deakon Frix E Booker, 2:07 PM

## 2016-10-12 NOTE — Anesthesia Postprocedure Evaluation (Signed)
Anesthesia Post Note  Patient: Briana Booker  Procedure(s) Performed: * No procedures listed *     Patient location during evaluation: Mother Baby Anesthesia Type: Epidural Level of consciousness: awake and alert and oriented Pain management: satisfactory to patient Vital Signs Assessment: post-procedure vital signs reviewed and stable Respiratory status: spontaneous breathing and nonlabored ventilation Cardiovascular status: stable Postop Assessment: no headache, no backache, no signs of nausea or vomiting, adequate PO intake and patient able to bend at knees (patient up walking) Anesthetic complications: no    Last Vitals:  Vitals:   10/12/16 0027 10/12/16 0540  BP: 109/61 113/77  Pulse: 74 78  Resp: 18 18  Temp: 36.9 C 36.9 C    Last Pain:  Vitals:   10/12/16 0540  TempSrc: Oral  PainSc:    Pain Goal:                 Madison HickmanGREGORY,Lashaun Krapf

## 2016-10-12 NOTE — Progress Notes (Signed)
CSW received consult for "previously on Zoloft."  CSW reviewed MOB's PNR from this pregnancy and last, as well as RN Admission Summaries, H &P, and past progress notes and does not see a dx of Anxiety/Depression noted anywhere in MOB's chart.  CSW is screening out referral and asks to be called if current concerns arise or by MOB's request. 

## 2016-10-12 NOTE — Plan of Care (Signed)
Problem: Nutritional: Goal: Mothers verbalization of comfort with breastfeeding process will improve Provided coconut oil to patient for sore nipples. Discussed importance of deep latch and nipple care.

## 2016-10-28 ENCOUNTER — Ambulatory Visit: Payer: Self-pay

## 2016-10-28 NOTE — Lactation Note (Addendum)
This note was copied from a baby's chart. Lactation Consult  Mother's reason for visit: Mother feels she is not producing enough milk between 6-12.  Mother is supplementing with ebm and formula.mother reports that after using a nipple shield it is hard to get infant to open his mouth. Infant is 34 weeks old and is not up to birth weight.  Birth weight was 9-2, todays weight is 8-12  Visit Type feeding assessment  Consult:  Initial Lactation Consultant:  Michel Bickers  ________________________________________________________________________    ________________________________________________________________________  Mother's Name: Dion Body Type of delivery:  Vaginal, Spontaneous Delivery Breastfeeding Experience:  36 yr old with 2 other children Maternal Medical Conditions:  history of migranes Maternal Medications:  Prenatal vits  ________________________________________________________________________  Breastfeeding History (Post Discharge)  Frequency of breastfeeding:every 2 hours Duration of feeding: 20-40 mins  Mother supplemented at first due to sore nipples , she also used a nipple shield   Pumping  Type of pump:  Medela pump in style Frequency:  After every feeding 10-15  mins on each side and power pumping once daily Volume: 45 ml  Infant Intake and Output Assessment  Voids: 8-10  in 24 hrs.  Color:  Clear yellow Stools:  5 in 24 hrs.  Color:  Yellow  ________________________________________________________________________  Maternal Breast Assessment  Breast:  Full Nipple:  Erect Pain level:  4, on the rt side Pain interventions:  Bra, coconut oil  _______________________________________________________________________ Feeding Assessment/Evaluation  Initial feeding assessment:  Infant's oral assessment:  Variance, tight posterior tongue tie observed  Positioning:  Football Left breast  LATCH documentation:  Latch:  1 =  Repeated attempts needed to sustain latch, nipple held in mouth throughout feeding, stimulation needed to elicit sucking reflex.  Audible swallowing:  2 = Spontaneous and intermittent  Type of nipple:  2 = Everted at rest and after stimulation  Comfort (Breast/Nipple):  1 = Filling, red/small blisters or bruises, mild/mod discomfort  Hold (Positioning):  1 = Assistance needed to correctly position infant at breast and maintain latch  LATCH score:  7  Attached assessment:  Deep  Lips flanged:  Yes.    Lips untucked:  Yes.    Suck assessment:  Displays both  Pre-feed weight:8-12.6,3988 Post-feed weight: 8-13.6, 4016  Amount transferred: 28 ml   Additional Feeding Assessment - discussed using a SNS for supplementing and mother prefers bottles feeding  Positioning:  Cross cradle Right breast  LATCH documentation:  Latch:  1 = Repeated attempts needed to sustain latch, nipple held in mouth throughout feeding, stimulation needed to elicit sucking reflex.  Audible swallowing:  2 = Spontaneous and intermittent  Type of nipple:  2 = Everted at rest and after stimulation  Comfort (Breast/Nipple):  1 = Filling, red/small blisters or bruises, mild/mod discomfort  Hold (Positioning):  1 = Assistance needed to correctly position infant at breast and maintain latch  LATCH score:  7  Attached assessment:  Deep  Lips flanged:  Yes.    Lips untucked:  Yes.    Suck assessment:  Displays both   Pre-feed weight: 8-13.6,4016  Post-feed weight:  [id number] Amount transferred:  8 ml Amount supplemented:  30 ml   Total amount transferred: 36  Ml Supplemented with 30 ml of formula  Mother advised to use nipple to nose latch technique to get wider latch Continue to cue base feed and at least 34 times in 24 hour Position pillow support well, bring baby to breast quickly with wide open mouth Supplement  infant with 30-45 ml of expressed breastmilk or formula Mother to post pump after each  feeding at least 6 times daily Referral to Dr Lexine BatonHisaw for evaluation of posterior tongue tie [facility] if infant has a revision Mother taught to use Paced bottle feeding Smart start to come on Tuesday for weight check

## 2017-04-28 IMAGING — US US OB TRANSVAGINAL
1 series · 14 of 28 positions shown · non-contrast
Comparison: None.

CLINICAL DATA: Vaginal bleeding for 1 day.

EXAM:
OBSTETRIC <14 WK US AND TRANSVAGINAL OB US
TECHNIQUE: Both transabdominal and transvaginal ultrasound examinations were
performed for complete evaluation of the gestation as well as the
maternal uterus, adnexal regions, and pelvic cul-de-sac.
Transvaginal technique was performed to assess early pregnancy.

[Series 1: us ob transvaginal · 0.23mm/px · 14 of 101 slices shown]
[im 4/101]
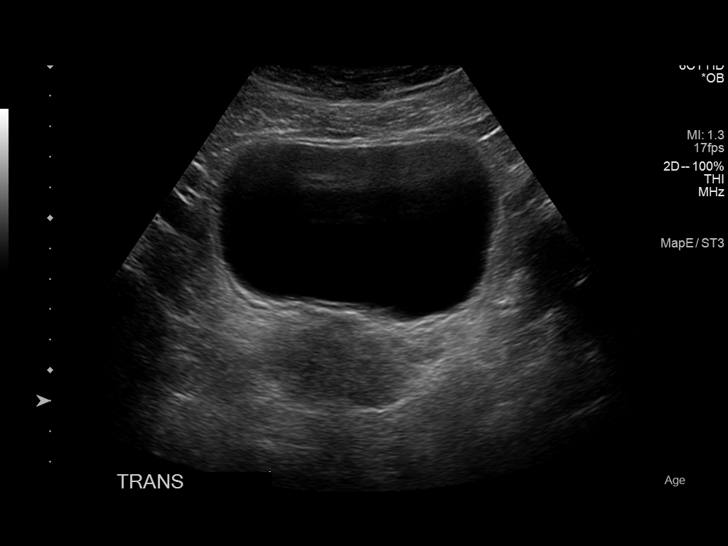
[im 12/101]
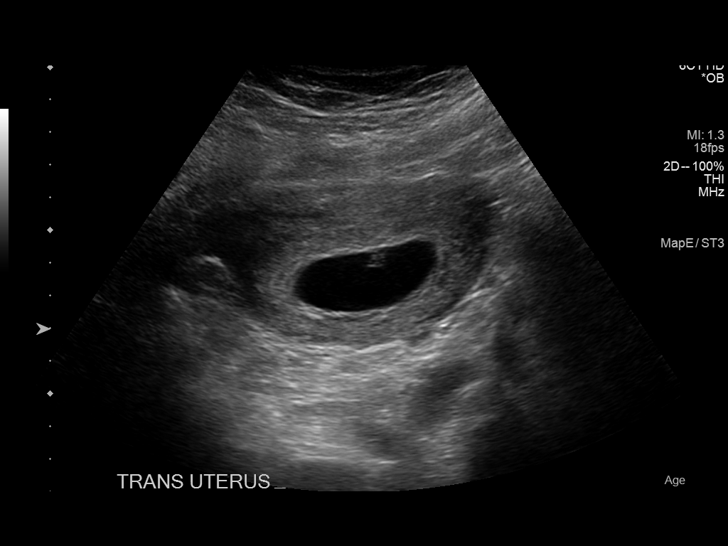
[im 19/101]
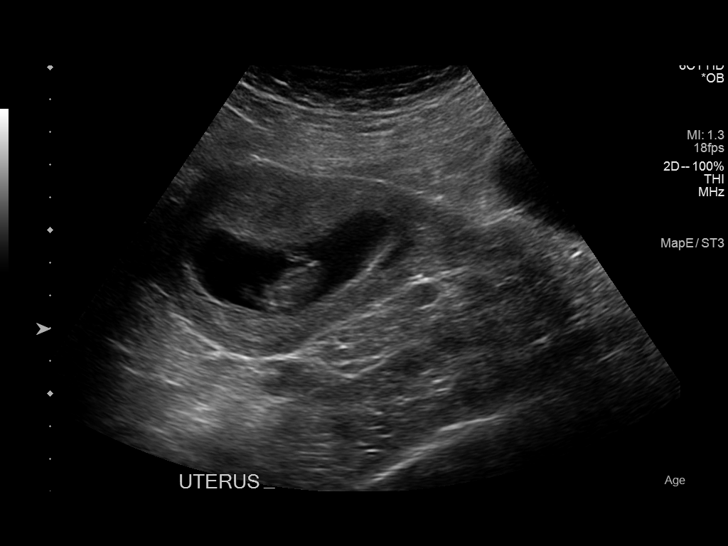
[im 26/101]
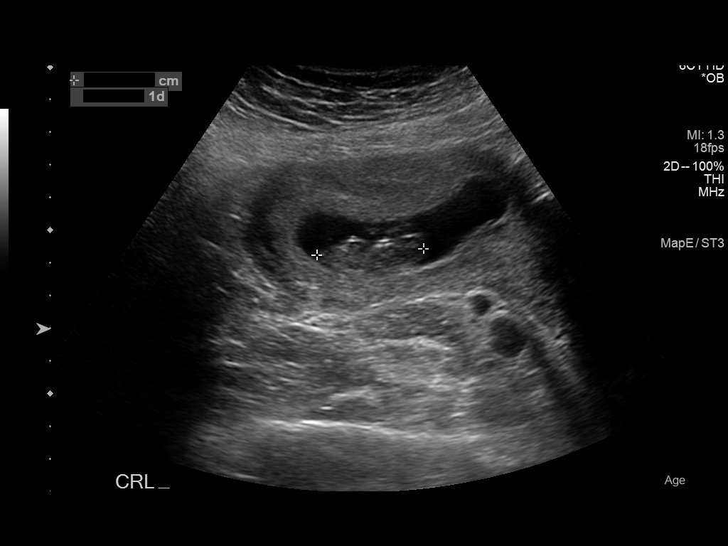
[im 34/101]
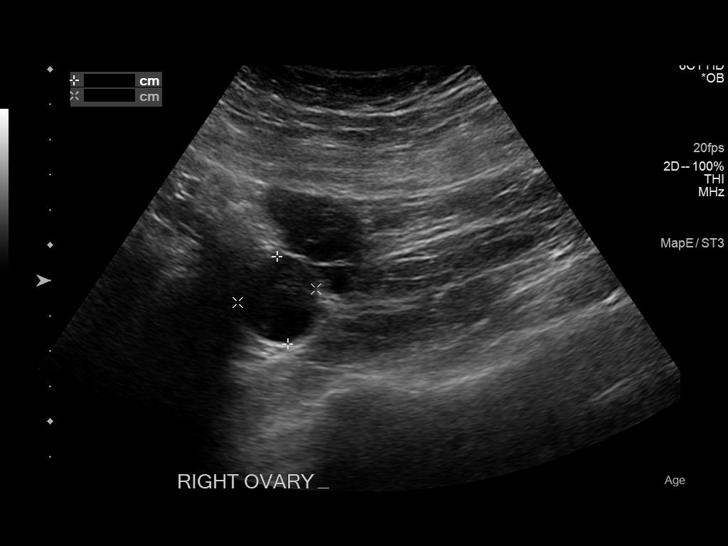
[im 41/101]
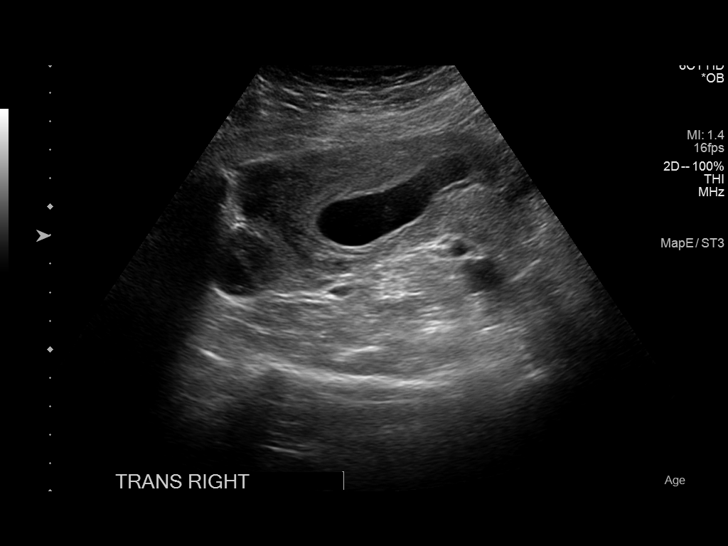
[im 49/101]
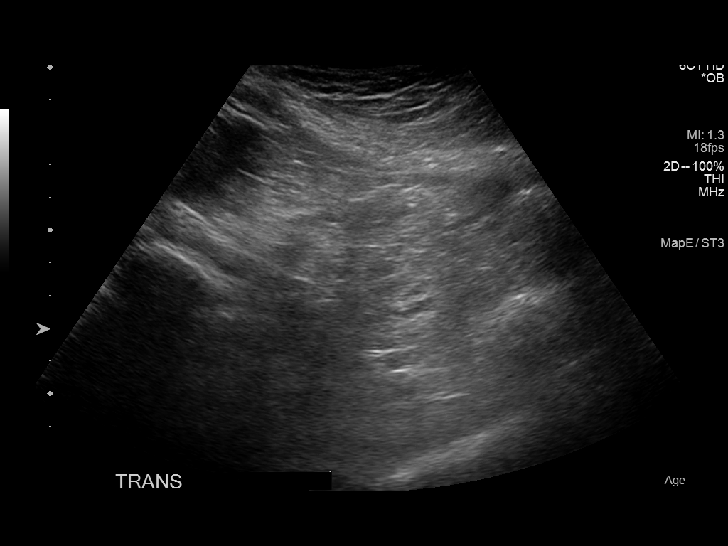
[im 56/101]
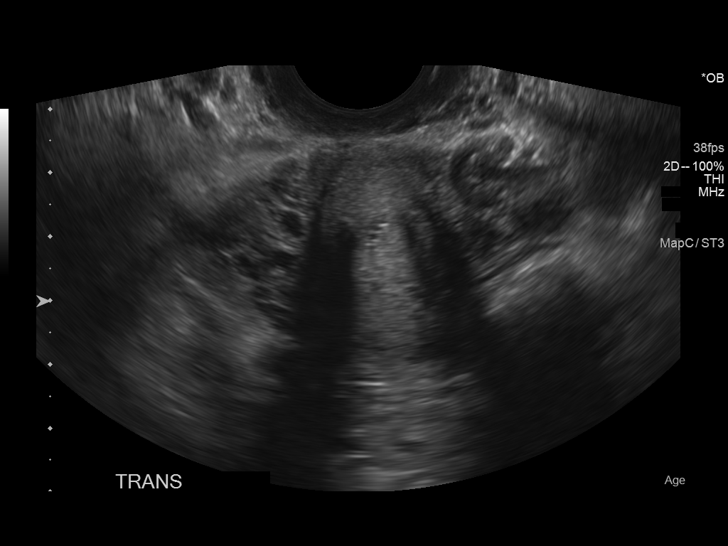
[im 63/101]
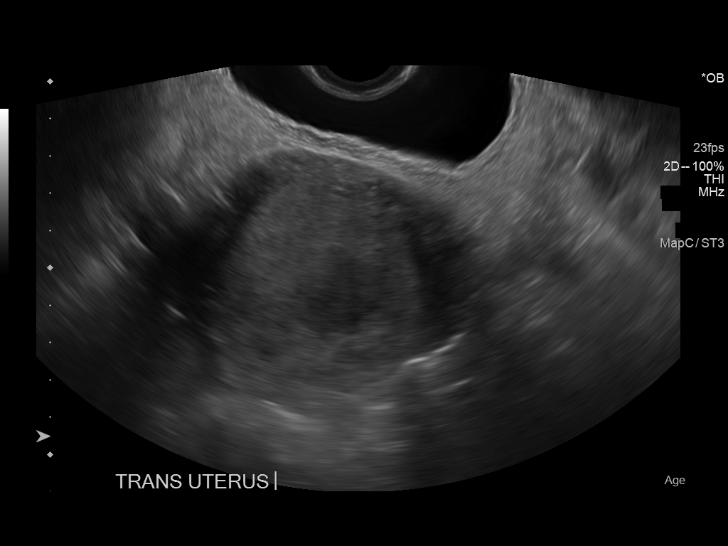
[im 71/101]
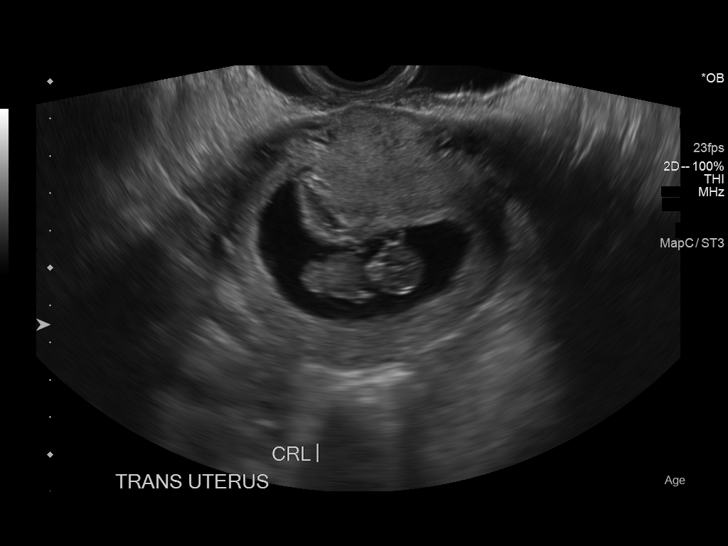
[im 78/101]
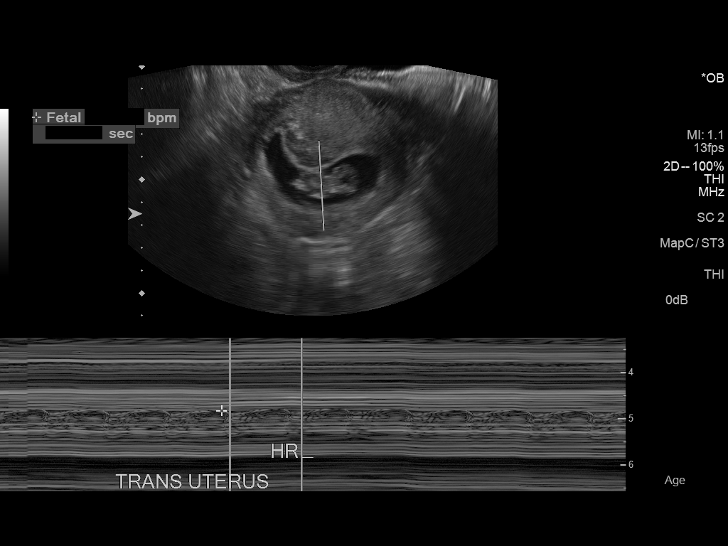
[im 86/101]
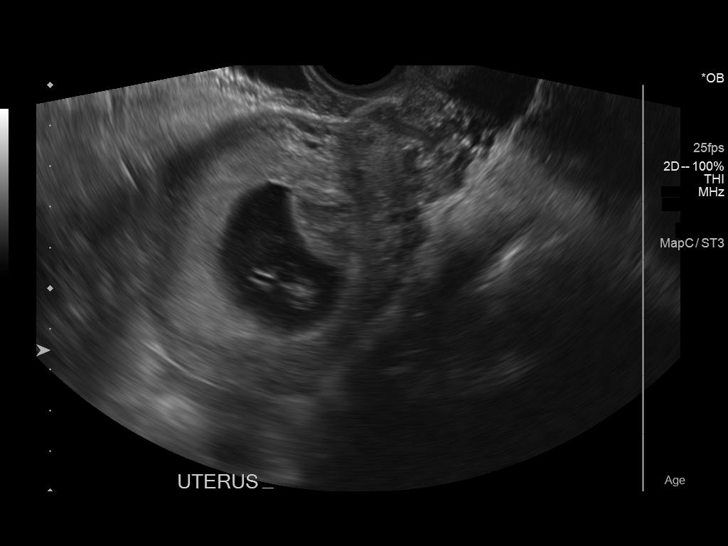
[im 93/101]
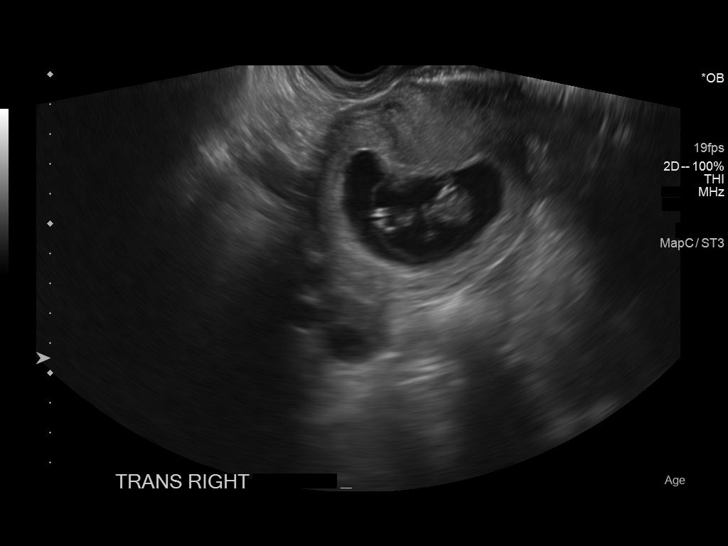
[im 101/101]
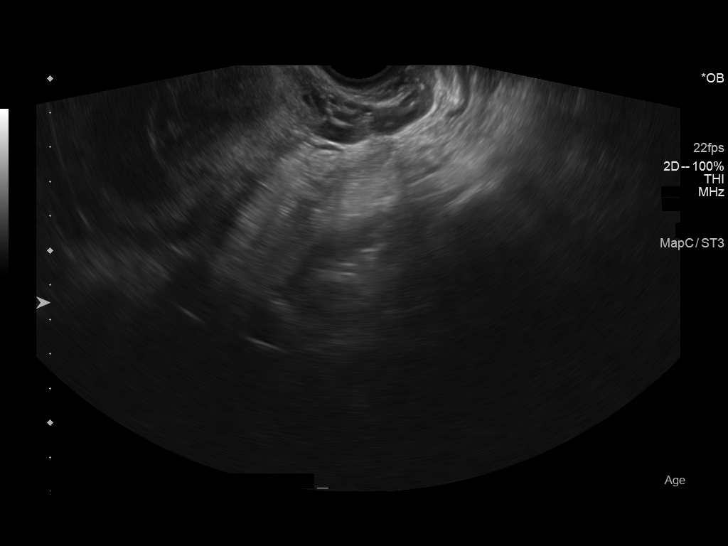

[14 of 28 positions shown; findings below may reference images not displayed]

FINDINGS: Intrauterine gestational sac: Single

Yolk sac:  Visualized.

Embryo:  Visualized.

Cardiac Activity: Visualized.

Heart Rate: 143  bpm

CRL:  32.9  mm   10 w   1 d                  US EDC: 10/20/2015

Subchorionic hemorrhage:  Small subchorionic hemorrhage.

Maternal uterus/adnexae: 2.5 x 2.3 x 2.1 cm hypoechoic right ovarian
mass likely reflecting a hemorrhagic cyst or endometrioma. Left
ovary is not visualized. No pelvic free fluid.
IMPRESSION: 1. Single live intrauterine pregnancy as described above.
2. Small subchorionic hemorrhage.

## 2018-12-18 ENCOUNTER — Other Ambulatory Visit: Payer: Self-pay

## 2018-12-18 DIAGNOSIS — Z20822 Contact with and (suspected) exposure to covid-19: Secondary | ICD-10-CM

## 2018-12-19 LAB — NOVEL CORONAVIRUS, NAA: SARS-CoV-2, NAA: NOT DETECTED

## 2019-03-29 ENCOUNTER — Other Ambulatory Visit: Payer: Self-pay

## 2019-03-29 DIAGNOSIS — Z20822 Contact with and (suspected) exposure to covid-19: Secondary | ICD-10-CM

## 2019-04-02 LAB — NOVEL CORONAVIRUS, NAA: SARS-CoV-2, NAA: NOT DETECTED

## 2019-04-03 ENCOUNTER — Telehealth: Payer: Self-pay

## 2019-04-03 NOTE — Telephone Encounter (Signed)
Patient given negative result and verbalized understanding  

## 2019-06-26 ENCOUNTER — Telehealth: Payer: BC Managed Care – PPO | Admitting: Nurse Practitioner

## 2019-06-26 DIAGNOSIS — J029 Acute pharyngitis, unspecified: Secondary | ICD-10-CM

## 2019-06-26 NOTE — Progress Notes (Signed)
E-Visits are not used to request refills.  After reviewing your records, zoloft is not a prescribed medicine on you medication list and therefore I cannot fill it for you. You will need to contact your PCP  We are sorry that you are not feeling well.  Here is how we plan to help!  Your symptoms indicate a likely viral infection (Pharyngitis).   Pharyngitis is inflammation in the back of the throat which can cause a sore throat, scratchiness and sometimes difficulty swallowing.   Pharyngitis is typically caused by a respiratory virus and will just run its course.  Please keep in mind that your symptoms could last up to 10 days.  For throat pain, we recommend over the counter oral pain relief medications such as acetaminophen or aspirin, or anti-inflammatory medications such as ibuprofen or naproxen sodium.  Topical treatments such as oral throat lozenges or sprays may be used as needed.  Avoid close contact with loved ones, especially the very young and elderly.  Remember to wash your hands thoroughly throughout the day as this is the number one way to prevent the spread of infection and wipe down door knobs and counters with disinfectant.  After careful review of your answers, I would not recommend and antibiotic for your condition.  Antibiotics should not be used to treat conditions that we suspect are caused by viruses like the virus that causes the common cold or flu. However, some people can have Strep with atypical symptoms. You may need formal testing in clinic or office to confirm if your symptoms continue or worsen.  Providers prescribe antibiotics to treat infections caused by bacteria. Antibiotics are very powerful in treating bacterial infections when they are used properly.  To maintain their effectiveness, they should be used only when necessary.  Overuse of antibiotics has resulted in the development of super bugs that are resistant to treatment!    Home Care:  Only take medications as  instructed by your medical team.  Do not drink alcohol while taking these medications.  A steam or ultrasonic humidifier can help congestion.  You can place a towel over your head and breathe in the steam from hot water coming from a faucet.  Avoid close contacts especially the very young and the elderly.  Cover your mouth when you cough or sneeze.  Always remember to wash your hands.  Get Help Right Away If:  You develop worsening fever or throat pain.  You develop a severe head ache or visual changes.  Your symptoms persist after you have completed your treatment plan.  Make sure you  Understand these instructions.  Will watch your condition.  Will get help right away if you are not doing well or get worse.  Your e-visit answers were reviewed by a board certified advanced clinical practitioner to complete your personal care plan.  Depending on the condition, your plan could have included both over the counter or prescription medications.  If there is a problem please reply  once you have received a response from your provider.  Your safety is important to [place].  If you have drug allergies check your prescription carefully.    You can use MyChart to ask questions about todays visit, request a non-urgent call back, or ask for a work or school excuse for 24 hours related to this e-Visit. If it has been greater than 24 hours you will need to follow up with your provider, or enter a new e-Visit to address those concerns.  You  will get an e-mail in the next two days asking about your experience.  I hope that your e-visit has been valuable and will speed your recovery. Thank you for using e-visits.   5-10 minutes spent reviewing and documenting in chart.
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
## 2019-10-23 ENCOUNTER — Other Ambulatory Visit: Payer: Self-pay

## 2019-10-23 ENCOUNTER — Encounter: Payer: Self-pay | Admitting: Adult Health Nurse Practitioner

## 2019-10-23 ENCOUNTER — Ambulatory Visit (INDEPENDENT_AMBULATORY_CARE_PROVIDER_SITE_OTHER): Payer: BC Managed Care – PPO | Admitting: Adult Health Nurse Practitioner

## 2019-10-23 VITALS — BP 120/84 | HR 95 | Temp 97.5°F | Ht 61.0 in | Wt 186.0 lb

## 2019-10-23 DIAGNOSIS — B3731 Acute candidiasis of vulva and vagina: Secondary | ICD-10-CM

## 2019-10-23 DIAGNOSIS — Z7689 Persons encountering health services in other specified circumstances: Secondary | ICD-10-CM

## 2019-10-23 DIAGNOSIS — B373 Candidiasis of vulva and vagina: Secondary | ICD-10-CM

## 2019-10-23 DIAGNOSIS — F411 Generalized anxiety disorder: Secondary | ICD-10-CM

## 2019-10-23 DIAGNOSIS — G43119 Migraine with aura, intractable, without status migrainosus: Secondary | ICD-10-CM | POA: Diagnosis not present

## 2019-10-23 DIAGNOSIS — Z79899 Other long term (current) drug therapy: Secondary | ICD-10-CM

## 2019-10-23 MED ORDER — FLUCONAZOLE 150 MG PO TABS: ORAL_TABLET | ORAL | 0 refills | Status: DC

## 2019-10-23 MED ORDER — CLONAZEPAM 0.5 MG PO TABS: 0.5000 mg | ORAL_TABLET | Freq: Two times a day (BID) | ORAL | 0 refills | Status: DC

## 2019-11-02 NOTE — Patient Instructions (Signed)
declined
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
## 2019-11-13 ENCOUNTER — Ambulatory Visit (INDEPENDENT_AMBULATORY_CARE_PROVIDER_SITE_OTHER): Payer: BC Managed Care – PPO | Admitting: Adult Health Nurse Practitioner

## 2019-11-13 ENCOUNTER — Encounter: Payer: Self-pay | Admitting: Adult Health Nurse Practitioner

## 2019-11-13 ENCOUNTER — Other Ambulatory Visit: Payer: Self-pay

## 2019-11-13 VITALS — BP 106/68 | HR 88 | Temp 97.7°F | Resp 16 | Ht 62.0 in | Wt 187.0 lb

## 2019-11-13 DIAGNOSIS — I10 Essential (primary) hypertension: Secondary | ICD-10-CM | POA: Diagnosis not present

## 2019-11-13 DIAGNOSIS — Z0000 Encounter for general adult medical examination without abnormal findings: Secondary | ICD-10-CM | POA: Diagnosis not present

## 2019-11-13 DIAGNOSIS — Z13220 Encounter for screening for lipoid disorders: Secondary | ICD-10-CM

## 2019-11-13 DIAGNOSIS — Z131 Encounter for screening for diabetes mellitus: Secondary | ICD-10-CM

## 2019-11-13 DIAGNOSIS — Z130 Encounter for screening for diseases of the blood and blood-forming organs and certain disorders involving the immune mechanism: Secondary | ICD-10-CM

## 2019-11-13 DIAGNOSIS — Z1329 Encounter for screening for other suspected endocrine disorder: Secondary | ICD-10-CM

## 2019-11-13 DIAGNOSIS — G43119 Migraine with aura, intractable, without status migrainosus: Secondary | ICD-10-CM

## 2019-11-13 DIAGNOSIS — Z79899 Other long term (current) drug therapy: Secondary | ICD-10-CM

## 2019-11-13 DIAGNOSIS — Z1389 Encounter for screening for other disorder: Secondary | ICD-10-CM

## 2019-11-13 DIAGNOSIS — B3731 Acute candidiasis of vulva and vagina: Secondary | ICD-10-CM

## 2019-11-13 DIAGNOSIS — Z136 Encounter for screening for cardiovascular disorders: Secondary | ICD-10-CM | POA: Diagnosis not present

## 2019-11-13 DIAGNOSIS — Z1321 Encounter for screening for nutritional disorder: Secondary | ICD-10-CM

## 2019-11-13 DIAGNOSIS — E559 Vitamin D deficiency, unspecified: Secondary | ICD-10-CM | POA: Diagnosis not present

## 2019-11-13 DIAGNOSIS — F411 Generalized anxiety disorder: Secondary | ICD-10-CM

## 2019-11-13 MED ORDER — VENLAFAXINE HCL ER 37.5 MG PO CP24: ORAL_CAPSULE | ORAL | 0 refills | Status: DC

## 2019-11-13 NOTE — Patient Instructions (Signed)
Begin to taper your Zoloft (Sertraline).  Start taking two tablets daily for one week, then one tablet daily for one week, then stop.    Start taking Effexor (Venlafaxine) 37.5mg  one tablet daily for one week, then increase to two tablets daily.  This medication can help with anxiety and can also help with migraines.  Please contact us if you have any questions or concerns.  We will follow up in 4-6 weeks.   Venlafaxine tablets What is this medicine? VENLAFAXINE (VEN la fax een) is used to treat depression, anxiety and panic disorder. This medicine may be used for other purposes; ask your health care provider or pharmacist if you have questions. COMMON BRAND NAME(S): Effexor What should I tell my health care provider before I take this medicine? They need to know if you have any of these conditions:  bleeding disorders  glaucoma  heart disease  high blood pressure  high cholesterol  kidney disease  liver disease  low levels of sodium in the blood  mania or bipolar disorder  seizures  suicidal thoughts, plans, or attempt; a previous suicide attempt by you or a family  take medicines that treat or prevent blood clots  thyroid disease  an unusual or allergic reaction to venlafaxine, desvenlafaxine, other medicines, foods, dyes, or preservatives  pregnant or trying to get pregnant  breast-feeding How should I use this medicine? Take this medicine by mouth with a glass of water. Follow the directions on the prescription label. Take it with food. Take your medicine at regular intervals. Do not take your medicine more often than directed. Do not stop taking this medicine suddenly except upon the advice of your doctor. Stopping this medicine too quickly may cause serious side effects or your condition may worsen. A special MedGuide will be given to you by the pharmacist with each prescription and refill. Be sure to read this information carefully each time. Talk to your  pediatrician regarding the use of this medicine in children. Special care may be needed. Overdosage: If you think you have taken too much of this medicine contact a poison control center or emergency room at once. NOTE: This medicine is only for you. Do not share this medicine with others. What if I miss a dose? If you miss a dose, take it as soon as you can. If it is almost time for your next dose, take only that dose. Do not take double or extra doses. What may interact with this medicine? Do not take this medicine with any of the following medications:  certain medicines for fungal infections like fluconazole, itraconazole, ketoconazole, posaconazole, voriconazole  cisapride  desvenlafaxine  dronedarone  duloxetine  levomilnacipran  linezolid  MAOIs like Carbex, Eldepryl, Marplan, Nardil, and Parnate  methylene blue (injected into a vein)  milnacipran  pimozide  thioridazine This medicine may also interact with the following medications:  amphetamines  aspirin and aspirin-like medicines  certain medicines for depression, anxiety, or psychotic disturbances  certain medicines for migraine headaches like almotriptan, eletriptan, frovatriptan, naratriptan, rizatriptan, sumatriptan, zolmitriptan  certain medicines for sleep  certain medicines that treat or prevent blood clots like dalteparin, enoxaparin, warfarin  cimetidine  clozapine  diuretics  fentanyl  furazolidone  indinavir  isoniazid  lithium  metoprolol  NSAIDS, medicines for pain and inflammation, like ibuprofen or naproxen  other medicines that prolong the QT interval (cause an abnormal heart rhythm) like dofetilide, ziprasidone  procarbazine  rasagiline  supplements like St. John's wort, kava kava, valerian  tramadol  tryptophan This list may not describe all possible interactions. Give your health care provider a list of all the medicines, herbs, non-prescription drugs, or dietary  supplements you use. Also tell them if you smoke, drink alcohol, or use illegal drugs. Some items may interact with your medicine. What should I watch for while using this medicine? Tell your doctor if your symptoms do not get better or if they get worse. Visit your doctor or health care professional for regular checks on your progress. Because it may take several weeks to see the full effects of this medicine, it is important to continue your treatment as prescribed by your doctor. Patients and their families should watch out for new or worsening thoughts of suicide or depression. Also watch out for sudden changes in feelings such as feeling anxious, agitated, panicky, irritable, hostile, aggressive, impulsive, severely restless, overly excited and hyperactive, or not being able to sleep. If this happens, especially at the beginning of treatment or after a change in dose, call your health care professional. This medicine can cause an increase in blood pressure. Check with your doctor for instructions on monitoring your blood pressure while taking this medicine. You may get drowsy or dizzy. Do not drive, use machinery, or do anything that needs mental alertness until you know how this medicine affects you. Do not stand or sit up quickly, especially if you are an older patient. This reduces the risk of dizzy or fainting spells. Alcohol may interfere with the effect of this medicine. Avoid alcoholic drinks. Your mouth may get dry. Chewing sugarless gum, sucking hard candy and drinking plenty of water will help. Contact your doctor if the problem does not go away or is severe. What side effects may I notice from receiving this medicine? Side effects that you should report to your doctor or health care professional as soon as possible:  allergic reactions like skin rash, itching or hives, swelling of the face, lips, or tongue  anxious  breathing problems  confusion  changes in vision  chest  pain  confusion  elevated mood, decreased need for sleep, racing thoughts, impulsive behavior  eye pain  fast, irregular heartbeat  feeling faint or lightheaded, falls  feeling agitated, angry, or irritable  hallucination, loss of contact with reality  high blood pressure  loss of balance or coordination  palpitations  redness, blistering, peeling or loosening of the skin, including inside the mouth  restlessness, pacing, inability to keep still  seizures  stiff muscles  suicidal thoughts or other mood changes  trouble passing urine or change in the amount of urine  trouble sleeping  unusual bleeding or bruising  unusually weak or tired  vomiting Side effects that usually do not require medical attention (report to your doctor or health care professional if they continue or are bothersome):  change in sex drive or performance  change in appetite or weight  constipation  dizziness  dry mouth  headache  increased sweating  nausea  tired This list may not describe all possible side effects. Call your doctor for medical advice about side effects. You may report side effects to FDA at 1-800-FDA-1088. Where should I keep my medicine? Keep out of the reach of children. Store at a controlled temperature between 20 and 25 degrees C (68 and 77 degrees F), in a dry place. Throw away any unused medicine after the expiration date. NOTE: This sheet is a summary. It may not cover all possible information. If you have questions  about this medicine, talk to your doctor, pharmacist, or health care provider.  2020 Elsevier/Gold Standard (2018-04-18 12:08:23)   We have given you samples of Ubrelvy to try.  Take this at the onset of a migraine.  You may take one tablet and may take second tablet one hour later if not resolved.    Common Migraine Triggers   Foods Aged cheese, alcohol, nuts, chocolate, yogurt, onions, figs, liver, caffeinated foods and beverages,  monosodium glutamate (MSG), smoked or pickled fish/meat, nitrate/nitrate preserved foods (hotdogs, pepperoni, salami) tyramine  Medications Antibiotics (tetracycline, griseofulvin), antihypertensives (nifedipine, captopril), hormones (oral contraceptives, estrogens), histamine-2 blockers (cimetidine, raniidine, vasodilators (nitroglycerine, isosorbide dinitrate)  Sensory Stimuli Flickering/bright/fluorescent lights, bright sunlight, odors (perfume, chemicals, cigarette smoke)  Lifestyle Changes Time zones, sleep patterns, eating habits, caffeine withdrawal stress  Other Menstrual cycle, weather/season/air pressure changes, high altitude  Adapted from [place], [place]. Clin. J. Med. 1995; [place] and [place]. Conquering Headache, 1998  Hormonal variations also are believed to play a part.  Fluctuations of the female hormone estrogen (such as just before menstruation) affect a chemical called serotonin-when serotonin levels in the brain fall, the dilation (expansion) of blood vessels in the brain that is characteristic of migraine often follows.  Many factors or "triggers" can start a migraine.  In people who get migraines, most experts think certain activities or foods may trigger temporary changes in the blood vessels around the brain.  Swelling of these blood vessels may cause pain in the nearby nerves.  Allergy Headaches:  Hotdogs Milk  Onions  Thyme Bacon  Chocolate Garlic  Nutmeg Ham  Dark Cola Pork  Cinnamon Salami  Nuts  Egg  Ginger Sausage Red wine Cloves  Cheddar Cheese Caffeine

## 2019-11-14 ENCOUNTER — Other Ambulatory Visit: Payer: Self-pay | Admitting: Adult Health Nurse Practitioner

## 2019-11-14 ENCOUNTER — Encounter: Payer: Self-pay | Admitting: Adult Health Nurse Practitioner

## 2019-11-14 DIAGNOSIS — E538 Deficiency of other specified B group vitamins: Secondary | ICD-10-CM | POA: Insufficient documentation

## 2019-11-14 DIAGNOSIS — E559 Vitamin D deficiency, unspecified: Secondary | ICD-10-CM

## 2019-11-14 LAB — HEMOGLOBIN A1C
Hgb A1c MFr Bld: 5 % of total Hgb (ref <5.7)
Mean Plasma Glucose: 97 (calc)
eAG (mmol/L): 5.4 (calc)

## 2019-11-14 LAB — CBC WITH DIFFERENTIAL/PLATELET
Absolute Monocytes: 435 cells/uL (ref 200–950)
Basophils Absolute: 21 cells/uL (ref 0–200)
Basophils Relative: 0.4 %
Eosinophils Absolute: 201 cells/uL (ref 15–500)
Eosinophils Relative: 3.8 %
HCT: 37.5 % (ref 35.0–45.0)
Hemoglobin: 12.6 g/dL (ref 11.7–15.5)
Lymphs Abs: 1304 cells/uL (ref 850–3900)
MCH: 31.1 pg (ref 27.0–33.0)
MCHC: 33.6 g/dL (ref 32.0–36.0)
MCV: 92.6 fL (ref 80.0–100.0)
MPV: 9.9 fL (ref 7.5–12.5)
Monocytes Relative: 8.2 %
Neutro Abs: 3339 cells/uL (ref 1500–7800)
Neutrophils Relative %: 63 %
Platelets: 267 10*3/uL (ref 140–400)
RBC: 4.05 10*6/uL (ref 3.80–5.10)
RDW: 12.2 % (ref 11.0–15.0)
Total Lymphocyte: 24.6 %
WBC: 5.3 10*3/uL (ref 3.8–10.8)

## 2019-11-14 LAB — LIPID PANEL
Cholesterol: 205 mg/dL — ABNORMAL HIGH (ref <200)
HDL: 71 mg/dL (ref >=50)
LDL Cholesterol (Calc): 112 mg/dL (calc) — ABNORMAL HIGH
Non-HDL Cholesterol (Calc): 134 mg/dL (calc) — ABNORMAL HIGH (ref <130)
Total CHOL/HDL Ratio: 2.9 (calc) (ref <5.0)
Triglycerides: 108 mg/dL (ref <150)

## 2019-11-14 LAB — COMPLETE METABOLIC PANEL WITH GFR
AG Ratio: 1.6 (calc) (ref 1.0–2.5)
ALT: 11 U/L (ref 6–29)
AST: 14 U/L (ref 10–30)
Albumin: 4.3 g/dL (ref 3.6–5.1)
Alkaline phosphatase (APISO): 53 U/L (ref 31–125)
BUN: 16 mg/dL (ref 7–25)
CO2: 28 mmol/L (ref 20–32)
Calcium: 9.3 mg/dL (ref 8.6–10.2)
Chloride: 103 mmol/L (ref 98–110)
Creat: 0.94 mg/dL (ref 0.50–1.10)
GFR, Est African American: 90 mL/min/{1.73_m2} (ref >=60)
GFR, Est Non African American: 78 mL/min/{1.73_m2} (ref >=60)
Globulin: 2.7 g/dL (calc) (ref 1.9–3.7)
Glucose, Bld: 93 mg/dL (ref 65–99)
Potassium: 4.7 mmol/L (ref 3.5–5.3)
Sodium: 137 mmol/L (ref 135–146)
Total Bilirubin: 0.3 mg/dL (ref 0.2–1.2)
Total Protein: 7 g/dL (ref 6.1–8.1)

## 2019-11-14 LAB — TSH: TSH: 2.13 mIU/L

## 2019-11-14 LAB — VITAMIN B12: Vitamin B-12: 994 pg/mL (ref 200–1100)

## 2019-11-14 LAB — IRON, TOTAL/TOTAL IRON BINDING CAP
%SAT: 21 % (calc) (ref 16–45)
Iron: 71 ug/dL (ref 40–190)
TIBC: 340 mcg/dL (calc) (ref 250–450)

## 2019-11-14 LAB — VITAMIN D 25 HYDROXY (VIT D DEFICIENCY, FRACTURES): Vit D, 25-Hydroxy: 31 ng/mL (ref 30–100)

## 2019-11-14 MED ORDER — CHOLECALCIFEROL 1.25 MG (50000 UT) PO CAPS: ORAL_CAPSULE | ORAL | 0 refills | Status: DC

## 2019-11-16 LAB — URINALYSIS W MICROSCOPIC + REFLEX CULTURE
Bacteria, UA: NONE SEEN /HPF
Bilirubin Urine: NEGATIVE
Glucose, UA: NEGATIVE
Hgb urine dipstick: NEGATIVE
Hyaline Cast: NONE SEEN /LPF
Ketones, ur: NEGATIVE
Leukocyte Esterase: NEGATIVE
Nitrites, Initial: NEGATIVE
Protein, ur: NEGATIVE
RBC / HPF: NONE SEEN /HPF (ref 0–2)
Specific Gravity, Urine: 1.017 (ref 1.001–1.03)
Squamous Epithelial / HPF: NONE SEEN /HPF (ref <=5)
WBC, UA: NONE SEEN /HPF (ref 0–5)
pH: 6 (ref 5.0–8.0)

## 2019-11-16 LAB — MICROALBUMIN / CREATININE URINE RATIO
Creatinine, Urine: 96 mg/dL (ref 20–275)
Microalb Creat Ratio: 3 mcg/mg creat (ref <30)
Microalb, Ur: 0.3 mg/dL

## 2019-11-16 LAB — NO CULTURE INDICATED

## 2019-11-18 ENCOUNTER — Other Ambulatory Visit: Payer: Self-pay | Admitting: Adult Health Nurse Practitioner

## 2019-11-18 DIAGNOSIS — F411 Generalized anxiety disorder: Secondary | ICD-10-CM

## 2019-11-19 ENCOUNTER — Other Ambulatory Visit: Payer: Self-pay

## 2019-11-19 DIAGNOSIS — E559 Vitamin D deficiency, unspecified: Secondary | ICD-10-CM

## 2019-11-20 ENCOUNTER — Other Ambulatory Visit: Payer: Self-pay | Admitting: Adult Health Nurse Practitioner

## 2019-11-20 DIAGNOSIS — F411 Generalized anxiety disorder: Secondary | ICD-10-CM

## 2019-11-20 MED ORDER — CHOLECALCIFEROL 1.25 MG (50000 UT) PO CAPS: ORAL_CAPSULE | ORAL | 0 refills | Status: AC

## 2019-11-20 MED ORDER — VENLAFAXINE HCL ER 75 MG PO CP24: ORAL_CAPSULE | ORAL | 1 refills | Status: DC

## 2019-12-06 ENCOUNTER — Other Ambulatory Visit: Payer: Self-pay | Admitting: Adult Health Nurse Practitioner

## 2019-12-06 DIAGNOSIS — F411 Generalized anxiety disorder: Secondary | ICD-10-CM

## 2019-12-06 MED ORDER — VENLAFAXINE HCL ER 75 MG PO CP24: 75.0000 mg | ORAL_CAPSULE | Freq: Every day | ORAL | 2 refills | Status: DC

## 2019-12-06 MED ORDER — VENLAFAXINE HCL ER 37.5 MG PO CP24: ORAL_CAPSULE | ORAL | 0 refills | Status: DC
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
## 2019-12-25 ENCOUNTER — Ambulatory Visit: Payer: BC Managed Care – PPO | Admitting: Adult Health Nurse Practitioner

## 2020-01-15 ENCOUNTER — Encounter: Payer: Self-pay | Admitting: Adult Health Nurse Practitioner

## 2020-01-15 ENCOUNTER — Other Ambulatory Visit: Payer: Self-pay

## 2020-01-15 ENCOUNTER — Ambulatory Visit (INDEPENDENT_AMBULATORY_CARE_PROVIDER_SITE_OTHER): Payer: BC Managed Care – PPO | Admitting: Adult Health Nurse Practitioner

## 2020-01-15 VITALS — BP 130/90 | HR 97 | Temp 97.2°F | Wt 187.0 lb

## 2020-01-15 DIAGNOSIS — G43119 Migraine with aura, intractable, without status migrainosus: Secondary | ICD-10-CM | POA: Diagnosis not present

## 2020-01-15 DIAGNOSIS — J0140 Acute pansinusitis, unspecified: Secondary | ICD-10-CM

## 2020-01-15 DIAGNOSIS — Z1152 Encounter for screening for COVID-19: Secondary | ICD-10-CM

## 2020-01-15 MED ORDER — MONTELUKAST SODIUM 10 MG PO TABS: 10.0000 mg | ORAL_TABLET | Freq: Every day | ORAL | 2 refills | Status: DC

## 2020-01-15 MED ORDER — DEXAMETHASONE 0.5 MG PO TABS: ORAL_TABLET | ORAL | 0 refills | Status: DC

## 2020-01-15 MED ORDER — AZITHROMYCIN 250 MG PO TABS: ORAL_TABLET | ORAL | 1 refills | Status: AC

## 2020-01-15 MED ORDER — UBRELVY 100 MG PO TABS: 100.0000 mg | ORAL_TABLET | ORAL | 3 refills | Status: DC | PRN

## 2020-01-15 NOTE — Progress Notes (Signed)
Assessment and Plan:  Briana Booker was seen today for acute visit.  Diagnoses and all orders for this visit:  Acute non-recurrent pansinusitis -     azithromycin (ZITHROMAX) 250 MG tablet; Take 2 tablets (500 mg) on  Day 1,  followed by 1 tablet (250 mg) once daily on Days 2 through 5. -     dexamethasone (DECADRON) 0.5 MG tablet; Take 1 tab 3 x day - 3 days, then 2 x day - 3 days, then 1 tab daily -     montelukast (SINGULAIR) 10 MG tablet; Take 1 tablet (10 mg total) by mouth daily.  Intractable migraine with aura without status migrainosus -     Ubrogepant (UBRELVY) 100 MG TABS; Take 100 mg by mouth as needed. at onset of migraine. May take second tablet two hours later if not resolved.  Encounter for screening for COVID-19 -     POC COVID-19 BinaxNow       Further disposition pending results of labs. Discussed med's effects and SE's.   Over 30 minutes of exam, counseling, chart review, and critical decision making was performed.   Future Appointments  Date Time Provider Department Center  01/15/2020 12:00 PM Briana Negus, NP [facility] None  11/18/2020 10:00 AM Mihira Tozzi, Bella Kennedy, NP [facility] None    ------------------------------------------------------------------------------------------------------------------   HPI 39 y.o.female presents for evaluation od sinus symptoms.  She reports that Saturday she had an increase in crying related to loose of.  Sunday she reports she started having pressure in your sinuses and tenderness underher yes and on left side of her neck.  Reports it started as runny and now congested.  Denies any oltagia.  Some sore throat relate to drainage and some hoarseness.  She has been been taking Dayquil and Nightquil.  She reports that she has been having and increase in migraines with duration of two days at times.  She has been trying to use Excedrin migraine but this has not been resolving her symptoms.  She was given sample of ubrevly last OV and  this was effective in treating her migraine as well as preventing return and decreasing frequency.  In the past she has tried topiramate and this made her fell bad so she was still not able to continue work or duties at home.  She also reports she has tried triptains in the past, sumatriptan, and this amplified her anxiety and caused fast heart beats.  Do not have record dates as she is a new patient to our practice as of 10/23/19.    Past Medical History:  Diagnosis Date  . Anemia   . H/O varicella   . Headache(784.0)    migraine  . Normal pregnancy 10/11/2016  . SVD (spontaneous vaginal delivery) 10/11/2016     No Known Allergies  Current Outpatient Medications on File Prior to Visit  Medication Sig  . Cholecalciferol 1.25 MG (50000 UT) capsule Take one tablet by mouth three days a week for twelve weeks.  . clonazePAM (KLONOPIN) 1 MG tablet Take half to one tablet three times a day as needed for anxiety.  . fluconazole (DIFLUCAN) 150 MG tablet Take one tablet at onset of symptoms and second tablet three days later for yeast.  . MAGNESIUM PO Take by mouth daily.  Marland Kitchen venlafaxine XR (EFFEXOR XR) 37.5 MG 24 hr capsule Take one capsule daily, for seven days.  Marland Kitchen venlafaxine XR (EFFEXOR XR) 75 MG 24 hr capsule Take 1 capsule (75 mg total) by mouth daily.   No current  facility-administered medications on file prior to visit.    ROS: all negative except above.   Physical Exam:  There were no vitals taken for this visit.  General Appearance: Well nourished, in no apparent distress. Eyes: PERRLA, EOMs, conjunctiva no swelling or erythema Sinuses: No Frontal/maxillary tenderness ENT/Mouth: Ext aud canals clear, TMs without erythema, bulging. No erythema, swelling, or exudate on post pharynx.  Tonsils not swollen or erythematous. Hearing normal.  Neck: Supple, thyroid normal.  Respiratory: Respiratory effort normal, BS equal bilaterally without rales, rhonchi, wheezing or stridor.  Cardio: RRR  with no MRGs. Brisk peripheral pulses without edema.  Abdomen: Soft, + BS.  Non tender, no guarding, rebound, hernias, masses. Lymphatics: Non tender without lymphadenopathy.  Musculoskeletal: Full ROM, 5/5 strength, normal gait.  Skin: Warm, dry without rashes, lesions, ecchymosis.  Neuro: Cranial nerves intact. Normal muscle tone, no cerebellar symptoms. Sensation intact.  Psych: Awake and oriented X 3, normal affect, Insight and Judgment appropriate.     Briana Negus, NP 9:40 AM [facility]

## 2020-01-15 NOTE — Patient Instructions (Addendum)
   You may need to change your antihistamines.   Zyrtec (Cetirazine)  Xyzal (Levociterizine)   Try Allegra (Fexofenadine)  We sent in a prescription for Singulair.  This is another antihistamine to try when you have an y increase in symptoms.   We have also sent in Azithromycin, antibiotic and dexamethasone a steroid.  Take this with food.   Please contact the office if you have any new or worsening symptoms.  Monitor for vaginal yeast infection from antibiotics.

## 2020-01-17 ENCOUNTER — Telehealth: Payer: Self-pay | Admitting: *Deleted

## 2020-01-17 NOTE — Telephone Encounter (Signed)
Left a message for patient to activate Ubrelvy coupon card and take to her [facility]. If she does not get medication with coupon card, we will have to precert through her insurance.

## 2020-01-23 LAB — POC COVID19 BINAXNOW: SARS Coronavirus 2 Ag: NEGATIVE

## 2020-04-06 ENCOUNTER — Other Ambulatory Visit: Payer: Self-pay | Admitting: Adult Health Nurse Practitioner

## 2020-04-06 DIAGNOSIS — J0140 Acute pansinusitis, unspecified: Secondary | ICD-10-CM

## 2020-04-30 ENCOUNTER — Other Ambulatory Visit: Payer: Self-pay

## 2020-04-30 ENCOUNTER — Ambulatory Visit (INDEPENDENT_AMBULATORY_CARE_PROVIDER_SITE_OTHER): Payer: BC Managed Care – PPO | Admitting: Adult Health Nurse Practitioner

## 2020-04-30 ENCOUNTER — Encounter: Payer: Self-pay | Admitting: Adult Health Nurse Practitioner

## 2020-04-30 VITALS — BP 110/80 | HR 106 | Temp 97.8°F | Wt 190.0 lb

## 2020-04-30 DIAGNOSIS — G43119 Migraine with aura, intractable, without status migrainosus: Secondary | ICD-10-CM | POA: Diagnosis not present

## 2020-04-30 DIAGNOSIS — Z79899 Other long term (current) drug therapy: Secondary | ICD-10-CM | POA: Diagnosis not present

## 2020-04-30 DIAGNOSIS — F411 Generalized anxiety disorder: Secondary | ICD-10-CM

## 2020-04-30 DIAGNOSIS — E559 Vitamin D deficiency, unspecified: Secondary | ICD-10-CM
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
## 2020-05-01 LAB — COMPLETE METABOLIC PANEL WITH GFR
AG Ratio: 1.5 (calc) (ref 1.0–2.5)
ALT: 13 U/L (ref 6–29)
AST: 13 U/L (ref 10–30)
Albumin: 4.5 g/dL (ref 3.6–5.1)
Alkaline phosphatase (APISO): 53 U/L (ref 31–125)
BUN: 17 mg/dL (ref 7–25)
CO2: 29 mmol/L (ref 20–32)
Calcium: 9.8 mg/dL (ref 8.6–10.2)
Chloride: 103 mmol/L (ref 98–110)
Creat: 0.8 mg/dL (ref 0.50–1.10)
GFR, Est African American: 109 mL/min/{1.73_m2} (ref >=60)
GFR, Est Non African American: 94 mL/min/{1.73_m2} (ref >=60)
Globulin: 3 g/dL (calc) (ref 1.9–3.7)
Glucose, Bld: 96 mg/dL (ref 65–99)
Potassium: 4.6 mmol/L (ref 3.5–5.3)
Sodium: 138 mmol/L (ref 135–146)
Total Bilirubin: 0.3 mg/dL (ref 0.2–1.2)
Total Protein: 7.5 g/dL (ref 6.1–8.1)

## 2020-05-01 LAB — CBC WITH DIFFERENTIAL/PLATELET
Absolute Monocytes: 536 cells/uL (ref 200–950)
Basophils Absolute: 20 cells/uL (ref 0–200)
Basophils Relative: 0.3 %
Eosinophils Absolute: 241 cells/uL (ref 15–500)
Eosinophils Relative: 3.6 %
HCT: 40 % (ref 35.0–45.0)
Hemoglobin: 13.3 g/dL (ref 11.7–15.5)
Lymphs Abs: 1588 cells/uL (ref 850–3900)
MCH: 30.2 pg (ref 27.0–33.0)
MCHC: 33.3 g/dL (ref 32.0–36.0)
MCV: 90.7 fL (ref 80.0–100.0)
MPV: 9.7 fL (ref 7.5–12.5)
Monocytes Relative: 8 %
Neutro Abs: 4315 cells/uL (ref 1500–7800)
Neutrophils Relative %: 64.4 %
Platelets: 331 10*3/uL (ref 140–400)
RBC: 4.41 10*6/uL (ref 3.80–5.10)
RDW: 12 % (ref 11.0–15.0)
Total Lymphocyte: 23.7 %
WBC: 6.7 10*3/uL (ref 3.8–10.8)

## 2020-08-21 ENCOUNTER — Encounter (INDEPENDENT_AMBULATORY_CARE_PROVIDER_SITE_OTHER): Payer: BC Managed Care – PPO

## 2020-08-21 DIAGNOSIS — J302 Other seasonal allergic rhinitis: Secondary | ICD-10-CM | POA: Diagnosis not present

## 2020-08-21 DIAGNOSIS — J019 Acute sinusitis, unspecified: Secondary | ICD-10-CM | POA: Diagnosis not present

## 2020-08-21 MED ORDER — AMOXICILLIN-POT CLAVULANATE 875-125 MG PO TABS: 1.0000 | ORAL_TABLET | Freq: Two times a day (BID) | ORAL | 0 refills | Status: AC

## 2020-08-21 MED ORDER — PREDNISONE 20 MG PO TABS: ORAL_TABLET | ORAL | 0 refills | Status: AC

## 2020-08-21 NOTE — Telephone Encounter (Signed)
Virtual Visit via Telephone Note  I connected with Briana Booker on 08/21/2020  at 12:53 pm by telephone and verified that I am speaking with the correct person using two identifiers.  Location: Patient: home Provider: Merlene Pulling   I discussed the limitations, risks, security and privacy concerns of performing an evaluation and management service by telephone and the availability of in person appointments. I also discussed with the patient that there may be a patient responsible charge related to this service. The patient expressed understanding and agreed to proceed.  History of Present Illness:  40 y.o. with hx of seasonal allergies messaged to report progressive sinus sx. She has hx of season allergies (Spring/fall), on singulare, alternates zyrtec and Claritin daytime, Flonase. Reports has been having a worse than typical allergy season, congestion, watery/itchy eyes despite above treatment. Reports hx of allergy shots as a child for pollen.   In the last week notes watery/itchty eyes, nasal congestion with progressive sinus pressure/tenderness, sinus headache, hoarseness, mild dry cough. Denies fever/chills, dyspnea, productive cough. Added benadryl at night without significant improvement in congestion. Tried sudafed today without benefit   Current Outpatient Medications on File Prior to Visit  Medication Sig Dispense Refill  . Cholecalciferol 1.25 MG (50000 UT) capsule Take one tablet by mouth three days a week for twelve weeks. 36 capsule 0  . clonazePAM (KLONOPIN) 1 MG tablet Take half to one tablet three times a day as needed for anxiety. 90 tablet 0  . MAGNESIUM PO Take by mouth daily.    . montelukast (SINGULAIR) 10 MG tablet TAKE 1 TABLET BY MOUTH EVERY DAY 90 tablet 1  . venlafaxine XR (EFFEXOR XR) 75 MG 24 hr capsule Take 1 capsule (75 mg total) by mouth daily. 90 capsule 2   No current facility-administered medications on file prior to visit.    Allergies: No Known  Allergies Medical History:  has B12 deficiency on their problem list. Social History:   reports that she has never smoked. She has never used smokeless tobacco. She reports that she does not drink alcohol and does not use drugs.   Observations/Objective:  General : young female patient in no apparent distress HEENT: mild hoarseness, no cough for duration of visit Lungs: speaks in complete sentences, no audible wheezing, no apparent distress Neurological: alert, oriented x 3 Psychiatric: pleasant, judgement appropriate   She reports tenderness to sinus palpation, denies edema, erythema.    Assessment and Plan:  Diagnoses and all orders for this visit:  Acute non-recurrent sinusitis, unspecified location Seasonal allergies Discussed the importance of avoiding unnecessary antibiotic therapy, continue sudafed, suggested saline eye/nasal irrigations Nasal steroids, continue with rotating allergy pill, oral steroids offered If progressive despite steroid for 2-3 days or new onset fever start abx that was sent in  Reviewed allergic hygiene; avoid triggers, use barriers and irrigate after exposure when possible Recommend hydrating eye drops after eye irrigation Reporting progressive allergy seasons; may need to consider referral back to allergy specialist for shots Follow up as needed. -     predniSONE (DELTASONE) 20 MG tablet; 2 tablets daily for 3 days, 1 tablet daily for 4 days. -     amoxicillin-clavulanate (AUGMENTIN) 875-125 MG tablet; Take 1 tablet by mouth 2 (two) times daily for 10 days.  Follow Up Instructions:    I discussed the assessment and treatment plan with the patient. The patient was provided an opportunity to ask questions and all were answered. The patient agreed with the plan and demonstrated an  understanding of the instructions.   The patient was advised to call back or seek an in-person evaluation if the symptoms worsen or if the condition fails to improve as  anticipated.  I provided 15 minutes of non-face-to-face time during this encounter.   Dan Maker, NP

## 2020-10-09 ENCOUNTER — Other Ambulatory Visit: Payer: Self-pay | Admitting: Adult Health Nurse Practitioner

## 2020-10-09 DIAGNOSIS — J0140 Acute pansinusitis, unspecified: Secondary | ICD-10-CM

## 2020-11-18 ENCOUNTER — Encounter: Payer: BC Managed Care – PPO | Admitting: Adult Health Nurse Practitioner

## 2021-01-01 ENCOUNTER — Other Ambulatory Visit: Payer: Self-pay | Admitting: Adult Health Nurse Practitioner

## 2021-01-01 DIAGNOSIS — F411 Generalized anxiety disorder: Secondary | ICD-10-CM

## 2021-02-03 ENCOUNTER — Other Ambulatory Visit: Payer: Self-pay | Admitting: Nurse Practitioner

## 2021-02-03 ENCOUNTER — Other Ambulatory Visit: Payer: Self-pay | Admitting: Adult Health

## 2021-02-03 DIAGNOSIS — F411 Generalized anxiety disorder: Secondary | ICD-10-CM

## 2021-02-03 MED ORDER — VENLAFAXINE HCL ER 75 MG PO CP24: ORAL_CAPSULE | ORAL | 2 refills | Status: DC

## 2021-02-22 ENCOUNTER — Other Ambulatory Visit: Payer: Self-pay | Admitting: Nurse Practitioner

## 2021-02-22 DIAGNOSIS — F411 Generalized anxiety disorder: Secondary | ICD-10-CM

## 2021-05-25 ENCOUNTER — Other Ambulatory Visit: Payer: Self-pay | Admitting: Nurse Practitioner

## 2021-05-25 DIAGNOSIS — F411 Generalized anxiety disorder: Secondary | ICD-10-CM
